# Patient Record
Sex: Female | Born: 1985 | Race: White | Hispanic: No | Smoking: Never smoker
Health system: Southern US, Community
[De-identification: ages and names within clinical notes are randomized; demographics above are authoritative.]

## PROBLEM LIST (undated history)

## (undated) ENCOUNTER — Inpatient Hospital Stay (HOSPITAL_COMMUNITY): Payer: Self-pay

## (undated) ENCOUNTER — Emergency Department (HOSPITAL_COMMUNITY): Admission: EM | Payer: Self-pay | Source: Home / Self Care

## (undated) DIAGNOSIS — E282 Polycystic ovarian syndrome: Secondary | ICD-10-CM

## (undated) DIAGNOSIS — F32A Depression, unspecified: Secondary | ICD-10-CM

## (undated) DIAGNOSIS — Z8619 Personal history of other infectious and parasitic diseases: Secondary | ICD-10-CM

## (undated) DIAGNOSIS — F329 Major depressive disorder, single episode, unspecified: Secondary | ICD-10-CM

## (undated) HISTORY — DX: Major depressive disorder, single episode, unspecified: F32.9

## (undated) HISTORY — DX: Depression, unspecified: F32.A

## (undated) HISTORY — DX: Polycystic ovarian syndrome: E28.2

## (undated) HISTORY — DX: Personal history of other infectious and parasitic diseases: Z86.19

---
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
## 2007-03-25 ENCOUNTER — Emergency Department (HOSPITAL_COMMUNITY): Admission: EM | Admit: 2007-03-25 | Discharge: 2007-03-25 | Payer: Self-pay | Admitting: Family Medicine

## 2007-09-16 ENCOUNTER — Ambulatory Visit (HOSPITAL_COMMUNITY): Admission: RE | Admit: 2007-09-16 | Discharge: 2007-09-16 | Payer: Self-pay | Admitting: Family Medicine

## 2011-11-27 ENCOUNTER — Encounter (HOSPITAL_COMMUNITY): Payer: Self-pay | Admitting: *Deleted

## 2011-11-27 ENCOUNTER — Emergency Department (HOSPITAL_COMMUNITY)
Admission: EM | Admit: 2011-11-27 | Discharge: 2011-11-27 | Disposition: A | Discharge status: Home / Self Care | Payer: BC Managed Care – PPO | Attending: Emergency Medicine | Admitting: Emergency Medicine

## 2011-11-27 DIAGNOSIS — N390 Urinary tract infection, site not specified: Secondary | ICD-10-CM

## 2011-11-27 DIAGNOSIS — R1032 Left lower quadrant pain: Secondary | ICD-10-CM

## 2011-11-27 DIAGNOSIS — R599 Enlarged lymph nodes, unspecified: Secondary | ICD-10-CM | POA: Insufficient documentation

## 2011-11-27 LAB — URINALYSIS, ROUTINE W REFLEX MICROSCOPIC
Bilirubin Urine: NEGATIVE
Hgb urine dipstick: NEGATIVE
Ketones, ur: NEGATIVE mg/dL
Protein, ur: NEGATIVE mg/dL
Urobilinogen, UA: 1 mg/dL (ref 0.0–1.0)

## 2011-11-27 LAB — URINE MICROSCOPIC-ADD ON

## 2011-11-27 LAB — POCT I-STAT, CHEM 8
BUN: 13 mg/dL (ref 6–23)
Creatinine, Ser: 0.9 mg/dL (ref 0.50–1.10)
Glucose, Bld: 109 mg/dL — ABNORMAL HIGH (ref 70–99)
Hemoglobin: 12.6 g/dL (ref 12.0–15.0)
TCO2: 24 mmol/L (ref 0–100)

## 2011-11-27 LAB — WET PREP, GENITAL
Trich, Wet Prep: NONE SEEN
Yeast Wet Prep HPF POC: NONE SEEN

## 2011-11-27 MED ORDER — MORPHINE SULFATE 4 MG/ML IJ SOLN
4.0000 mg | Freq: Once | INTRAMUSCULAR | Status: AC
  Administered 2011-11-27: 4 mg via INTRAVENOUS
  Filled 2011-11-27: qty 1

## 2011-11-27 MED ORDER — NITROFURANTOIN MONOHYD MACRO 100 MG PO CAPS: 100.0000 mg | ORAL_CAPSULE | Freq: Two times a day (BID) | ORAL | Status: AC

## 2011-11-27 MED ORDER — KETOROLAC TROMETHAMINE 30 MG/ML IJ SOLN
30.0000 mg | Freq: Once | INTRAMUSCULAR | Status: AC
  Administered 2011-11-27: 30 mg via INTRAVENOUS
  Filled 2011-11-27: qty 1

## 2011-11-27 MED ORDER — CYCLOBENZAPRINE HCL 10 MG PO TABS: 10.0000 mg | ORAL_TABLET | Freq: Two times a day (BID) | ORAL | Status: AC | PRN

## 2011-11-27 MED ORDER — NAPROXEN 500 MG PO TABS: 500.0000 mg | ORAL_TABLET | Freq: Two times a day (BID) | ORAL | Status: DC

## 2011-11-27 NOTE — Discharge Instructions (Signed)
You were here for evaluation of your left groin pain. You have found to have a urinary tract infection. Please take antibiotic for the full duration as prescribed. For your groin pain, you may take pain medication and muscle relaxant as needed.  Do not drive or operate heavy machinery while taking medication as it can cause drowsiness.  You may use warm/cool compress as needed.  Call the list below to find a primary care doctor.  Return to ER if your symptoms worsen, or if you have other concerns.    Urinary Tract Infection Infections of the urinary tract can start in several places. A bladder infection (cystitis), a kidney infection (pyelonephritis), and a prostate infection (prostatitis) are different types of urinary tract infections (UTIs). They usually get better if treated with medicines (antibiotics) that kill germs. Take all the medicine until it is gone. You or your child may feel better in a few days, but TAKE ALL MEDICINE or the infection may not respond and may become more difficult to treat. HOME CARE INSTRUCTIONS   Drink enough water and fluids to keep the urine clear or pale yellow. Cranberry juice is especially recommended, in addition to large amounts of water.   Avoid caffeine, tea, and carbonated beverages. They tend to irritate the bladder.   Alcohol may irritate the prostate.   Only take over-the-counter or prescription medicines for pain, discomfort, or fever as directed by your caregiver.  To prevent further infections:  Empty the bladder often. Avoid holding urine for long periods of time.   After a bowel movement, women should cleanse from front to back. Use each tissue only once.   Empty the bladder before and after sexual intercourse.  FINDING OUT THE RESULTS OF YOUR TEST Not all test results are available during your visit. If your or your child's test results are not back during the visit, make an appointment with your caregiver to find out the results. Do not assume  everything is normal if you have not heard from your caregiver or the medical facility. It is important for you to follow up on all test results. SEEK MEDICAL CARE IF:   There is back pain.   Your baby is older than 3 months with a rectal temperature of 100.5 F (38.1 C) or higher for more than 1 day.   Your or your child's problems (symptoms) are no better in 3 days. Return sooner if you or your child is getting worse.  SEEK IMMEDIATE MEDICAL CARE IF:   There is severe back pain or lower abdominal pain.   You or your child develops chills.   You have a fever.   Your baby is older than 3 months with a rectal temperature of 102 F (38.9 C) or higher.   Your baby is 3 months old or younger with a rectal temperature of 100.4 F (38 C) or higher.   There is nausea or vomiting.   There is continued burning or discomfort with urination.  MAKE SURE YOU:   Understand these instructions.   Will watch your condition.   Will get help right away if you are not doing well or get worse.  Document Released: 03/06/2005 Document Revised: 05/16/2011 Document Reviewed: 10/09/2006 [facility] Patient Information 2012 [place], [place].  RESOURCE GUIDE  Chronic Pain Problems: Contact [facility]  [phone] Patients need to be referred by their primary care doctor.  Insufficient Money for Medicine: Contact United Way:  call "211" or [facility] 717-271-6504.  No Primary Care Doctor: - Call Health Connect  [phone] - can help you locate a primary care doctor that  accepts your insurance, provides certain services, etc. - Physician Referral Service- [phone]  Agencies that provide inexpensive medical care: - [facility]  [phone] - [facility]  [phone] - [facility]  [phone] - Women's Clinic  [phone] - [facility]  [phone] [facility]  [phone]  Medicaid-accepting [facility]  Providers: - [facility]- [address]  [phone], Mon-Fri 9am-7pm, Sat 9am-1pm - [facility]- [address] [place], Suite [place]  [phone] - [facility]- [address], [address] [place]  [phone] [facility]- [address]  [phone] - Renaye Rakers- [address] [place], [address], [phone]  Only accepts [place] patients after they have their name  applied to their card  Self Pay (no insurance) in [place]: - Sickle Cell Patients: Dr Willey Blade, [facility]  [address] [place], [phone] - [facility]- [address] [place]  [phone]       [facility] [place]- [address]       -     [facility]- see information above (Speak to [employer] if you do not have insurance)       -  [facility]- [address] [place], [phone]       -  [facility]- [address],  [phone]       -  [facility]- [address], [phone]       -  Dr Julio Sicks-  [address], [place], [phone]       -  [facility]- [address] [phone]       -  [facility]- [address], [phone], also [address], [phone]       -    [facility]- [address] [place], [phone], 1st & 3rd Saturday   every month, 10am-1pm  1) Find a Doctor and Pay Out of Pocket Although you won't have to find out who is covered by your insurance plan, it is a good idea to ask around and get recommendations. You will then need to call the office and see if the doctor you have chosen will accept you as a new patient and what types of options they offer for patients who are self-pay. Some doctors offer discounts or will set up payment plans for their patients who do not have insurance, but you will need to ask so you aren't surprised when you get to your  appointment.  2) Contact Your Local Health Department Not all health departments have doctors that can see patients for sick visits, but many do, so it is worth a call to see if yours does. If you don't know where your local health department is, you can check in your phone book. The CDC also has a tool to help you locate your state's health department, and many state websites also have listings of all of their local health departments.  3) Find a Walk-in Clinic If your illness is not likely to be very severe or complicated, you may want to try a walk in clinic. These are popping up all  over the country in pharmacies, drugstores, and shopping centers. They're usually staffed by nurse practitioners or physician assistants that have been trained to treat common illnesses and complaints. They're usually fairly quick and inexpensive. However, if you have serious medical issues or chronic medical problems, these are probably not your best option  STD Testing - [facility] Department of [facility], STD Clinic, [address] [place], phone [phone] or [phone].  Monday - Friday, call for an appointment. [facility] Department of [employer], STD Clinic, [place] [address], [place], phone [phone] or [phone].  Monday - Friday, call for an appointment.  Abuse/Neglect: [facility] Child Abuse Hotline 364-756-9166 [facility] Child Abuse Hotline 737-546-7237 (After Hours)  Emergency Shelter:  [facility] 612-806-3046  Maternity Homes: - Room at the [place] [facility] [phone] - [facility] [phone]  MRSA Hotline #:   484 282 3870  [facility]  [facility] [place]  United Way [facility] Dept. [address]                 [address]         [address] [place] [address]  Blondell Reveal Phone:  [phone]                                  Phone:  [phone]                   Phone:  [phone]  [facility], [phone] - [phone]       -     [facility] in [place], [address]                                  [phone], [facility] Child Abuse Hotline 747-219-1464 or (517)098-3536 (After Hours)   Behavioral Health Services  Substance Abuse Resources: - [facility] Drug Services  (818) [phone] - [facility] [phone] - [facility] [place] [phone] Floydene Flock (702)[phone] - Residential & Outpatient Substance Abuse Program  4108716673  Psychological Services: [facility]  5625391827 Services  [phone] - [facility], [phone] [place]. [address], [place], ACCESS LINE: [phone] or 3647816259, [email or web address]  Dental Assistance  If unable to pay or uninsured, contact:  [facility] or [facility]. to become qualified for the adult dental clinic.  Patients with Medicaid: [facility] [phone] [address], [phone] [address] [phone]  If unable to pay, or uninsured, contact [facility] (409)[phone]) or [facility] Department [phone] in [place], [phone] in [facility]) to become qualified for the adult dental clinic  Other Proofreader Services: - [facility]- [address], [place]  [place], [place], [address], [phone], Ext. [phone], 2nd and 4th Thursday of the month at 6:30am.  10 clients each day by appointment, can sometimes see walk-in patients if someone does not show for an appointment. [facility]- [address] Ether Griffins [place], [place], [address], [phone] - [facility]- [address] [place], [place], [address],  [phone] - [place] [facility]- [phone] [facility] Health Department- [phone] [facility] [phone] -

## 2011-11-27 NOTE — ED Notes (Signed)
Pt states she has L groin pain inside the muscle. She states when she moves it becomes a sharp stabbing pain. States she cannot bear weight to L leg secondary to pain.

## 2011-11-27 NOTE — ED Notes (Signed)
Pt reports pain is 1/10 without movement, but any movement brings pain to 7/10

## 2011-11-27 NOTE — ED Notes (Signed)
Pt reports noting left groin pain approx 2300 yesterday evening, pt awoke at 0200 w/ severe left groin pain that is exacerbated w/ movement.

## 2011-11-27 NOTE — ED Notes (Signed)
PA Tran at bedside. 

## 2011-11-27 NOTE — ED Notes (Signed)
Pt denies any trauma to leg. States she was sitting on couch after work when pain came on.

## 2011-11-27 NOTE — ED Notes (Signed)
Pelvic set up complete.

## 2011-11-27 NOTE — ED Provider Notes (Signed)
History     CSN: [id number]  Arrival date & time 11/27/11  0300   First MD Initiated Contact with Patient 11/27/11 [phone]      Chief Complaint  Patient presents with  . Groin Pain    (Consider location/radiation/quality/duration/timing/severity/associated sxs/prior treatment) HPI  26 year old female who is generally healthy presents complaining of left groin pain. Patient states she was sitting on the couch last night when she experienced a sharp and an achy sensation to her left groin. Pain was acute in onset, persistent, occasionally radiating down to the left knee with movement. Patient state pain felt deep within her groin.  Nothing seems to alleviate the pain, however leg movement seems to make it worse. Patient denies any precipitating factor. She denies any recent strenuous exercise, sexual activity , or trauma. Patient denies fever, chills, nausea, vomiting, diarrhea, chest pain, shortness of breath, abdominal pain, back pain, urinary symptoms. She does not recall her last menstrual period.  History reviewed. No pertinent past medical history.  Past Surgical History  Procedure Date  . Breast enhancement surgery     No family history on file.  History  Substance Use Topics  . Smoking status: Current Everyday Smoker  . Smokeless tobacco: Not on file  . Alcohol Use: Yes     socially    OB History    Grav Para Term Preterm Abortions TAB SAB Ect Mult Living                  Review of Systems  All other systems reviewed and are negative.    Allergies  Review of patient's allergies indicates no known allergies.  Home Medications   Current Outpatient Rx  Name Route Sig Dispense Refill  . LO LOESTRIN FE PO Oral Take 1 tablet by mouth daily.      BP 127/78  Pulse 116  Temp 98.2 F (36.8 C) (Oral)  Resp 20  SpO2 100%  Physical Exam  Nursing note and vitals reviewed. Constitutional: She appears well-developed and well-nourished. No distress.  HENT:  Head:  Normocephalic and atraumatic.  Eyes: Conjunctivae are normal.  Neck: Normal range of motion. Neck supple.  Cardiovascular: Normal rate and regular rhythm.   Pulmonary/Chest: Effort normal and breath sounds normal. She exhibits no tenderness.  Abdominal: Soft. There is no tenderness.  Genitourinary: Vagina normal and uterus normal. There is no rash or lesion on the right labia. There is no rash or lesion on the left labia. Cervix exhibits no motion tenderness and no discharge. Right adnexum displays no mass and no tenderness. Left adnexum displays no mass and no tenderness. No erythema, tenderness or bleeding around the vagina. No vaginal discharge found.  Musculoskeletal: She exhibits no edema.       Tenderness to left inguinal region without overlying skin changes. Increasing pain with left leg range of motion, specifically with hip flexion.  Lymphadenopathy:       Right: No inguinal adenopathy present.       Left: Inguinal adenopathy present.  Neurological:  Reflex Scores:      Patellar reflexes are 2+ on the right side and 2+ on the left side. Skin: No rash noted.    ED Course  Procedures (including critical care time)  Labs Reviewed  URINALYSIS, ROUTINE W REFLEX MICROSCOPIC - Abnormal; Notable for the following:    Color, Urine AMBER (*)  BIOCHEMICALS MAY BE AFFECTED BY COLOR   Nitrite POSITIVE (*)     Leukocytes, UA SMALL (*)  All other components within normal limits  URINE MICROSCOPIC-ADD ON - Abnormal; Notable for the following:    Squamous Epithelial / LPF FEW (*)     Bacteria, UA MANY (*)     All other components within normal limits  POCT PREGNANCY, URINE   No results found.   No diagnosis found.  Results for orders placed during the hospital encounter of 11/27/11  URINALYSIS, ROUTINE W REFLEX MICROSCOPIC      Component Value Range   Color, Urine AMBER (*) YELLOW   APPearance CLEAR  CLEAR   Specific Gravity, Urine 1.028  1.005 - 1.030   pH 7.0  5.0 - 8.0    Glucose, UA NEGATIVE  NEGATIVE mg/dL   Hgb urine dipstick NEGATIVE  NEGATIVE   Bilirubin Urine NEGATIVE  NEGATIVE   Ketones, ur NEGATIVE  NEGATIVE mg/dL   Protein, ur NEGATIVE  NEGATIVE mg/dL   Urobilinogen, UA 1.0  0.0 - 1.0 mg/dL   Nitrite POSITIVE (*) NEGATIVE   Leukocytes, UA SMALL (*) NEGATIVE  POCT PREGNANCY, URINE      Component Value Range   Preg Test, Ur NEGATIVE  NEGATIVE  URINE MICROSCOPIC-ADD ON      Component Value Range   Squamous Epithelial / LPF FEW (*) RARE   WBC, UA 7-10  <3 WBC/hpf   RBC / HPF 0-2  <3 RBC/hpf   Bacteria, UA MANY (*) RARE  WET PREP, GENITAL      Component Value Range   Yeast Wet Prep HPF POC NONE SEEN  NONE SEEN   Trich, Wet Prep NONE SEEN  NONE SEEN   Clue Cells Wet Prep HPF POC FEW (*) NONE SEEN   WBC, Wet Prep HPF POC MODERATE (*) NONE SEEN  POCT I-STAT, CHEM 8      Component Value Range   Sodium 141  135 - 145 mEq/L   Potassium 3.9  3.5 - 5.1 mEq/L   Chloride 106  96 - 112 mEq/L   BUN 13  6 - 23 mg/dL   Creatinine, Ser 1.91  0.50 - 1.10 mg/dL   Glucose, Bld 478 (*) 70 - 99 mg/dL   Calcium, Ion 2.95  6.21 - 1.32 mmol/L   TCO2 24  0 - 100 mmol/L   Hemoglobin 12.6  12.0 - 15.0 g/dL   HCT 30.8  65.7 - 84.6 %   No results found.    MDM  L groin pain.  UA shows evidence of UTI.  Plan to obtain pelvic exam for further eval. No calf pain or leg swelling.  Afebrile.  DDx: musculoskeletal strain, kidney stones, UTI, hernia, ovarian torsion.    7:37 AM Pelvic examination is unremarkable. No CMT. Patient does have left inguinal lymphadenopathy, and increased pain with left hip flexion.  UA with no evidence of bleeding to suggest kidney stone.  Pt has no CVA tenderness. No hernia noted. Abdomen otherwise unremarkable on exam.  Nonsurgical abdomen.  Low suspicion for ovarian torsion, tuboovarian abscess or kidney stone.  I discussed with my attending.  Our plan is to treat for UTI and f/u for reevaluation. Urine culture sent.  GC culture sent. Pt  voice understanding and agrees with plan.    8:22 AM Pain improves with toradol.       Fayrene Helper, PA-C 11/27/11 [phone]

## 2011-11-28 LAB — GC/CHLAMYDIA PROBE AMP, GENITAL
Chlamydia, DNA Probe: NEGATIVE
GC Probe Amp, Genital: NEGATIVE

## 2011-11-29 LAB — URINE CULTURE
Colony Count: >=100000
Culture  Setup Time: 201306191500

## 2011-11-29 NOTE — ED Provider Notes (Signed)
Medical screening examination/treatment/procedure(s) were performed by non-physician practitioner and as supervising physician I was immediately available for consultation/collaboration.   Hanley Seamen, MD 11/29/11 2238

## 2011-11-30 NOTE — ED Notes (Signed)
+  Urine. Patient treated with Macrobid. Sensitive to same. Per protocol MD. °

## 2012-10-31 ENCOUNTER — Inpatient Hospital Stay (HOSPITAL_COMMUNITY)
Admission: AD | Admit: 2012-10-31 | Discharge: 2012-10-31 | Disposition: A | Discharge status: Home / Self Care | Payer: BC Managed Care – PPO | Source: Ambulatory Visit | Attending: Obstetrics & Gynecology | Admitting: Obstetrics & Gynecology

## 2012-10-31 ENCOUNTER — Encounter (HOSPITAL_COMMUNITY): Payer: Self-pay | Admitting: *Deleted

## 2012-10-31 ENCOUNTER — Inpatient Hospital Stay (HOSPITAL_COMMUNITY): Payer: BC Managed Care – PPO

## 2012-10-31 DIAGNOSIS — Z3201 Encounter for pregnancy test, result positive: Secondary | ICD-10-CM | POA: Insufficient documentation

## 2012-10-31 DIAGNOSIS — N831 Corpus luteum cyst of ovary, unspecified side: Secondary | ICD-10-CM | POA: Insufficient documentation

## 2012-10-31 DIAGNOSIS — O26899 Other specified pregnancy related conditions, unspecified trimester: Secondary | ICD-10-CM

## 2012-10-31 DIAGNOSIS — R109 Unspecified abdominal pain: Secondary | ICD-10-CM

## 2012-10-31 LAB — HCG, QUANTITATIVE, PREGNANCY: hCG, Beta Chain, Quant, S: 3616 m[IU]/mL — ABNORMAL HIGH (ref <5)

## 2012-10-31 LAB — URINALYSIS, ROUTINE W REFLEX MICROSCOPIC
Bilirubin Urine: NEGATIVE
Nitrite: NEGATIVE
Protein, ur: NEGATIVE mg/dL
Specific Gravity, Urine: <1.005 — ABNORMAL LOW (ref 1.005–1.030)
Urobilinogen, UA: 0.2 mg/dL (ref 0.0–1.0)

## 2012-10-31 LAB — CBC
Hemoglobin: 12.3 g/dL (ref 12.0–15.0)
MCH: 30 pg (ref 26.0–34.0)
Platelets: 270 10*3/uL (ref 150–400)
RBC: 4.1 MIL/uL (ref 3.87–5.11)
WBC: 7.8 10*3/uL (ref 4.0–10.5)

## 2012-10-31 LAB — WET PREP, GENITAL
Clue Cells Wet Prep HPF POC: NONE SEEN
Trich, Wet Prep: NONE SEEN

## 2012-10-31 NOTE — MAU Note (Signed)
Pt reports she had lower abd pain since Sunday. C/o throbbing pain in LRQ. Positive HPT[facility] refereed to MAU due to RLQ pain and unknown LMP. Pt state she has not had a period in over a year Due to taking low estergen birth control pills. Stopped taking them about 2 months ago but still did not have a period.

## 2012-10-31 NOTE — MAU Provider Note (Signed)
History     CSN: [id number]  Arrival date and time: 10/31/12 1309   First Provider Initiated Contact with Patient 10/31/12 1442      Chief Complaint  Patient presents with  . Abdominal Pain   HPI Jaime Golden 26 y.o. Unknown LMP - none in last year as she was on low dose pills.  Having lower abdominal pain.  Worried as she had a positive pregnancy test.    OB History   Grav Para Term Preterm Abortions TAB SAB Ect Mult Living   1               History reviewed. No pertinent past medical history.  Past Surgical History  Procedure Laterality Date  . Breast enhancement surgery      History reviewed. No pertinent family history.  History  Substance Use Topics  . Smoking status: Current Every Day Smoker  . Smokeless tobacco: Not on file  . Alcohol Use: Yes     Comment: socially    Allergies: No Known Allergies  Prescriptions prior to admission  Medication Sig Dispense Refill  . naproxen sodium (ALEVE) 220 MG tablet Take 220 mg by mouth daily as needed.      . Prenatal Vit-Fe Fumarate-FA (PRENATAL MULTIVITAMIN) TABS Take 1 tablet by mouth daily at 12 noon.        Review of Systems  Constitutional: Negative for fever.  Gastrointestinal: Positive for abdominal pain. Negative for nausea, vomiting, diarrhea and constipation.  Genitourinary:       No vaginal discharge. No vaginal bleeding. No dysuria.   Physical Exam   Blood pressure 126/76, pulse 96, temperature 98 F (36.7 C), temperature source Oral, resp. rate 18, height 5\' 2"  (1.575 m), weight 170 lb 6.4 oz (77.293 kg).  Physical Exam  Nursing note and vitals reviewed. Constitutional: She is oriented to person, place, and time. She appears well-developed and well-nourished.  HENT:  Head: Normocephalic.  Eyes: EOM are normal.  Neck: Neck supple.  GI: Soft. There is tenderness. There is no rebound and no guarding.  Genitourinary:  Speculum exam: Vagina - Small amount of creamy discharge, no odor Cervix  - No contact bleeding Bimanual exam: Cervix closed Uterus non tender, normal size Adnexa non tender, no masses bilaterally GC/Chlam, wet prep done Chaperone present for exam.  Musculoskeletal: Normal range of motion.  Neurological: She is alert and oriented to person, place, and time.  Skin: Skin is warm and dry.  Psychiatric: She has a normal mood and affect.    MAU Course  Procedures Results for orders placed during the hospital encounter of 10/31/12 (from the past 24 hour(s))  URINALYSIS, ROUTINE W REFLEX MICROSCOPIC     Status: Abnormal   Collection Time    10/31/12  1:50 PM      Result Value Range   Color, Urine YELLOW  YELLOW   APPearance CLEAR  CLEAR   Specific Gravity, Urine <1.005 (*) 1.005 - 1.030   pH 6.0  5.0 - 8.0   Glucose, UA NEGATIVE  NEGATIVE mg/dL   Hgb urine dipstick NEGATIVE  NEGATIVE   Bilirubin Urine NEGATIVE  NEGATIVE   Ketones, ur NEGATIVE  NEGATIVE mg/dL   Protein, ur NEGATIVE  NEGATIVE mg/dL   Urobilinogen, UA 0.2  0.0 - 1.0 mg/dL   Nitrite NEGATIVE  NEGATIVE   Leukocytes, UA NEGATIVE  NEGATIVE  POCT PREGNANCY, URINE     Status: Abnormal   Collection Time    10/31/12  1:57 PM  Result Value Range   Preg Test, Ur POSITIVE (*) NEGATIVE  WET PREP, GENITAL     Status: Abnormal   Collection Time    10/31/12  2:55 PM      Result Value Range   Yeast Wet Prep HPF POC NONE SEEN  NONE SEEN   Trich, Wet Prep NONE SEEN  NONE SEEN   Clue Cells Wet Prep HPF POC NONE SEEN  NONE SEEN   WBC, Wet Prep HPF POC TOO NUMEROUS TO COUNT (*) NONE SEEN  HCG, QUANTITATIVE, PREGNANCY     Status: Abnormal   Collection Time    10/31/12  3:18 PM      Result Value Range   hCG, Beta Chain, Quant, S 3616 (*) <5 mIU/mL  CBC     Status: None   Collection Time    10/31/12  3:23 PM      Result Value Range   WBC 7.8  4.0 - 10.5 K/uL   RBC 4.10  3.87 - 5.11 MIL/uL   Hemoglobin 12.3  12.0 - 15.0 g/dL   HCT 65.7  84.6 - 96.2 %   MCV 90.7  78.0 - 100.0 fL   MCH 30.0  26.0  - 34.0 pg   MCHC 33.1  30.0 - 36.0 g/dL   RDW 95.2  84.1 - 32.4 %   Platelets 270  150 - 400 K/uL    MDM Clinical Data: Abdominal pain. Unknown LMP.  OBSTETRIC <14 WK [place] AND TRANSVAGINAL OB US  Technique: Both transabdominal and transvaginal ultrasound  examinations were performed for complete evaluation of the  gestation as well as the maternal uterus, adnexal regions, and  pelvic cul-de-sac. Transvaginal technique was performed to assess  early pregnancy.  Comparison: None.  Intrauterine gestational sac: Present  Yolk sac: Present  Embryo: Not seen  Cardiac Activity: Not seen  CRL: 6.0 mm 5 w 1 d [place] EDC: 07/02/2013  Maternal uterus/adnexae:  Left corpus luteum cyst. Normal appearance of the right ovary. No  subchorionic hemorrhage identified.  IMPRESSION:  Intrauterine gestational sac containing a yolk sac. Embryo is not  yet seen due to small size of the gestational sac. Follow-up  ultrasound in 10 - 14 days is recommended to document presence of  embryo and for dating purposes.    Assessment and Plan  IUGS at 5w 1d  Plan No smoking, no drugs, no alcohol.  Take a prenatal vitamin one by mouth every day.  Eat small frequent snacks to avoid nausea.  Begin prenatal care as soon as possible. Take Tylenol 325 mg 2 tablets by mouth every 4 hours if needed for pain. Drink at least 8 8-oz glasses of water every day.  Jaime Golden 10/31/2012, 4:14 PM

## 2012-11-01 LAB — GC/CHLAMYDIA PROBE AMP: CT Probe RNA: NEGATIVE

## 2012-12-02 LAB — OB RESULTS CONSOLE RPR: RPR: NONREACTIVE

## 2012-12-02 LAB — OB RESULTS CONSOLE GC/CHLAMYDIA
CHLAMYDIA, DNA PROBE: POSITIVE
Gonorrhea: NEGATIVE

## 2012-12-02 LAB — OB RESULTS CONSOLE ANTIBODY SCREEN: ANTIBODY SCREEN: NEGATIVE

## 2012-12-02 LAB — OB RESULTS CONSOLE ABO/RH: RH TYPE: POSITIVE

## 2012-12-02 LAB — OB RESULTS CONSOLE HEPATITIS B SURFACE ANTIGEN: HEP B S AG: NEGATIVE

## 2012-12-02 LAB — OB RESULTS CONSOLE HIV ANTIBODY (ROUTINE TESTING): HIV: NONREACTIVE

## 2012-12-02 LAB — OB RESULTS CONSOLE RUBELLA ANTIBODY, IGM: Rubella: IMMUNE

## 2013-06-10 NOTE — L&D Delivery Note (Signed)
Delivery Note At 7:29 AM a viable and healthy female was delivered via Vaginal, Spontaneous Delivery (Presentation: OA to LOT ).  APGAR: 9, 9; weight P.   Placenta status: Intact, Spontaneous.  Cord: 3V with the following complications: Nuchal.    Anesthesia: Epidural  Episiotomy: None Lacerations: 2nd degree, labial abrasion Suture Repair: 3.0 vicryl rapide Est. Blood Loss (mL): 450cc  Mom to postpartum.  Baby to Couplet care / Skin to Skin.  BOVARD,Abbagale Goguen 07/02/2013, 7:57 AM  Br/A+/ Mirena/ RI  D/W pt circumcision for female infant including r/b/a, wish to proceed

## 2013-06-11 LAB — OB RESULTS CONSOLE GBS: STREP GROUP B AG: NEGATIVE

## 2013-06-29 ENCOUNTER — Telehealth (HOSPITAL_COMMUNITY): Payer: Self-pay | Admitting: *Deleted

## 2013-06-29 ENCOUNTER — Encounter (HOSPITAL_COMMUNITY): Payer: Self-pay | Admitting: *Deleted

## 2013-06-29 NOTE — Telephone Encounter (Signed)
Preadmission screen  

## 2013-06-30 ENCOUNTER — Encounter (HOSPITAL_COMMUNITY): Payer: Self-pay | Admitting: *Deleted

## 2013-06-30 ENCOUNTER — Observation Stay (HOSPITAL_COMMUNITY)
Admission: AD | Admit: 2013-06-30 | Discharge: 2013-06-30 | Disposition: A | Discharge status: Home / Self Care | Payer: BC Managed Care – PPO | Source: Ambulatory Visit | Attending: Obstetrics and Gynecology | Admitting: Obstetrics and Gynecology

## 2013-06-30 DIAGNOSIS — O479 False labor, unspecified: Principal | ICD-10-CM | POA: Insufficient documentation

## 2013-06-30 DIAGNOSIS — IMO0001 Reserved for inherently not codable concepts without codable children: Secondary | ICD-10-CM

## 2013-06-30 LAB — CBC
HCT: 37 % (ref 36.0–46.0)
Hemoglobin: 12.7 g/dL (ref 12.0–15.0)
MCH: 31.6 pg (ref 26.0–34.0)
MCHC: 34.3 g/dL (ref 30.0–36.0)
MCV: 92 fL (ref 78.0–100.0)
PLATELETS: 191 10*3/uL (ref 150–400)
RBC: 4.02 MIL/uL (ref 3.87–5.11)
RDW: 13 % (ref 11.5–15.5)
WBC: 20.6 10*3/uL — AB (ref 4.0–10.5)

## 2013-06-30 LAB — RPR: RPR: NONREACTIVE

## 2013-06-30 MED ORDER — LACTATED RINGERS IV SOLN: 500.0000 mL | INTRAVENOUS | Status: DC | PRN

## 2013-06-30 MED ORDER — LACTATED RINGERS IV SOLN: INTRAVENOUS | Status: DC

## 2013-06-30 MED ORDER — OXYTOCIN 40 UNITS IN LACTATED RINGERS INFUSION - SIMPLE MED: 62.5000 mL/h | INTRAVENOUS | Status: DC

## 2013-06-30 MED ORDER — LIDOCAINE HCL (PF) 1 % IJ SOLN: 30.0000 mL | INTRAMUSCULAR | Status: DC | PRN

## 2013-06-30 MED ORDER — OXYCODONE-ACETAMINOPHEN 5-325 MG PO TABS: 1.0000 | ORAL_TABLET | ORAL | Status: DC | PRN

## 2013-06-30 MED ORDER — ACETAMINOPHEN 325 MG PO TABS: 650.0000 mg | ORAL_TABLET | ORAL | Status: DC | PRN

## 2013-06-30 MED ORDER — IBUPROFEN 600 MG PO TABS: 600.0000 mg | ORAL_TABLET | Freq: Four times a day (QID) | ORAL | Status: DC | PRN

## 2013-06-30 MED ORDER — OXYTOCIN BOLUS FROM INFUSION: 500.0000 mL | INTRAVENOUS | Status: DC

## 2013-06-30 MED ORDER — ONDANSETRON HCL 4 MG/2ML IJ SOLN: 4.0000 mg | Freq: Four times a day (QID) | INTRAMUSCULAR | Status: DC | PRN

## 2013-06-30 MED ORDER — CITRIC ACID-SODIUM CITRATE 334-500 MG/5ML PO SOLN
30.0000 mL | ORAL | Status: DC | PRN
Start: 2013-06-30 — End: 2013-06-30

## 2013-06-30 NOTE — Discharge Instructions (Signed)
Fetal Braxton Hicks Contractions Pregnancy is commonly associated with contractions of the uterus throughout the pregnancy. Towards the end of pregnancy (32 to 34 weeks), these contractions [facility]) can develop more often and may become more forceful. This is not true labor because these contractions do not result in opening (dilatation) and thinning of the cervix. They are sometimes difficult to tell apart from true labor because these contractions can be forceful and people have different pain tolerances. You should not feel embarrassed if you go to the hospital with false labor. Sometimes, the only way to tell if you are in true labor is for your caregiver to follow the changes in the cervix. How to tell the difference between true and false labor:  False labor.  The contractions of false labor are usually shorter, irregular and not as hard as those of true labor.  They are often felt in the front of the lower abdomen and in the groin.  They may leave with walking around or changing positions while lying down.  They get weaker and are shorter lasting as time goes on.  These contractions are usually irregular.  They do not usually become progressively stronger, regular and closer together as with true labor.  True labor.  Contractions in true labor last 30 to 70 seconds, become very regular, usually become more intense, and increase in frequency.  They do not go away with walking.  The discomfort is usually felt in the top of the uterus and spreads to the lower abdomen and low back.  True labor can be determined by your caregiver with an exam. This will show that the cervix is dilating and getting thinner. If there are no prenatal problems or other health problems associated with the pregnancy, it is completely safe to be sent home with false labor and await the onset of true labor. HOME CARE INSTRUCTIONS   Keep up with your usual exercises and instructions.  Take medications  as directed.  Keep your regular prenatal appointment.  Eat and drink lightly if you think you are going into labor.  If BH contractions are making you uncomfortable:  Change your activity position from lying down or resting to walking/walking to resting.  Sit and rest in a tub of warm water.  Drink 2 to 3 glasses of water. Dehydration may cause B-H contractions.  Do slow and deep breathing several times an hour. SEEK IMMEDIATE MEDICAL CARE IF:   Your contractions continue to become stronger, more regular, and closer together.  You have a gushing, burst or leaking of fluid from the vagina.  An oral temperature above 102 F (38.9 C) develops.  You have passage of blood-tinged mucus.  You develop vaginal bleeding.  You develop continuous belly (abdominal) pain.  You have low back pain that you never had before.  You feel the baby's head pushing down causing pelvic pressure.  The baby is not moving as much as it used to. Document Released: 05/27/2005 Document Revised: 08/19/2011 Document Reviewed: 03/08/2013 [facility] Patient Information 2014 [place], [place]. Fetal Movement Counts Patient Name: __________________________________________________ Patient Due Date: ____________________ Performing a fetal movement count is highly recommended in high-risk pregnancies, but it is good for every pregnant woman to do. Your caregiver may ask you to start counting fetal movements at 28 weeks of the pregnancy. Fetal movements often increase:  After eating a full meal.  After physical activity.  After eating or drinking something sweet or cold.  At rest. Pay attention to when you feel  the baby is most active. This will help you notice a pattern of your baby's sleep and wake cycles and what factors contribute to an increase in fetal movement. It is important to perform a fetal movement count at the same time each day when your baby is normally most active.  HOW TO COUNT FETAL  MOVEMENTS 1. Find a quiet and comfortable area to sit or lie down on your left side. Lying on your left side provides the best blood and oxygen circulation to your baby. 2. Write down the day and time on a sheet of paper or in a journal. 3. Start counting kicks, flutters, swishes, rolls, or jabs in a 2 hour period. You should feel at least 10 movements within 2 hours. 4. If you do not feel 10 movements in 2 hours, wait 2 3 hours and count again. Look for a change in the pattern or not enough counts in 2 hours. SEEK MEDICAL CARE IF:  You feel less than 10 counts in 2 hours, tried twice.  There is no movement in over an hour.  The pattern is changing or taking longer each day to reach 10 counts in 2 hours.  You feel the baby is not moving as he or she usually does. Date: ____________ Movements: ____________ Start time: ____________ Finish time: ____________  Date: ____________ Movements: ____________ Start time: ____________ Finish time: ____________ Date: ____________ Movements: ____________ Start time: ____________ Finish time: ____________ Date: ____________ Movements: ____________ Start time: ____________ Finish time: ____________ Date: ____________ Movements: ____________ Start time: ____________ Finish time: ____________ Date: ____________ Movements: ____________ Start time: ____________ Finish time: ____________ Date: ____________ Movements: ____________ Start time: ____________ Finish time: ____________ Date: ____________ Movements: ____________ Start time: ____________ Finish time: ____________  Date: ____________ Movements: ____________ Start time: ____________ Finish time: ____________ Date: ____________ Movements: ____________ Start time: ____________ Finish time: ____________ Date: ____________ Movements: ____________ Start time: ____________ Finish time: ____________ Date: ____________ Movements: ____________ Start time: ____________ Finish time: ____________ Date: ____________  Movements: ____________ Start time: ____________ Finish time: ____________ Date: ____________ Movements: ____________ Start time: ____________ Finish time: ____________ Date: ____________ Movements: ____________ Start time: ____________ Finish time: ____________  Date: ____________ Movements: ____________ Start time: ____________ Finish time: ____________ Date: ____________ Movements: ____________ Start time: ____________ Finish time: ____________ Date: ____________ Movements: ____________ Start time: ____________ Finish time: ____________ Date: ____________ Movements: ____________ Start time: ____________ Finish time: ____________ Date: ____________ Movements: ____________ Start time: ____________ Finish time: ____________ Date: ____________ Movements: ____________ Start time: ____________ Finish time: ____________ Date: ____________ Movements: ____________ Start time: ____________ Finish time: ____________  Date: ____________ Movements: ____________ Start time: ____________ Finish time: ____________ Date: ____________ Movements: ____________ Start time: ____________ Finish time: ____________ Date: ____________ Movements: ____________ Start time: ____________ Finish time: ____________ Date: ____________ Movements: ____________ Start time: ____________ Finish time: ____________ Date: ____________ Movements: ____________ Start time: ____________ Finish time: ____________ Date: ____________ Movements: ____________ Start time: ____________ Finish time: ____________ Date: ____________ Movements: ____________ Start time: ____________ Finish time: ____________  Date: ____________ Movements: ____________ Start time: ____________ Finish time: ____________ Date: ____________ Movements: ____________ Start time: ____________ Finish time: ____________ Date: ____________ Movements: ____________ Start time: ____________ Finish time: ____________ Date: ____________ Movements: ____________ Start time:  ____________ Finish time: ____________ Date: ____________ Movements: ____________ Start time: ____________ Finish time: ____________ Date: ____________ Movements: ____________ Start time: ____________ Finish time: ____________ Date: ____________ Movements: ____________ Start time: ____________ Finish time: ____________  Date: ____________ Movements: ____________ Start time: ____________ Finish time: ____________ Date: ____________ Movements: ____________ Start   time: ____________ Finish time: ____________ Date: ____________ Movements: ____________ Start time: ____________ Finish time: ____________ Date: ____________ Movements: ____________ Start time: ____________ Finish time: ____________ Date: ____________ Movements: ____________ Start time: ____________ Finish time: ____________ Date: ____________ Movements: ____________ Start time: ____________ Finish time: ____________ Date: ____________ Movements: ____________ Start time: ____________ Finish time: ____________  Date: ____________ Movements: ____________ Start time: ____________ Finish time: ____________ Date: ____________ Movements: ____________ Start time: ____________ Finish time: ____________ Date: ____________ Movements: ____________ Start time: ____________ Finish time: ____________ Date: ____________ Movements: ____________ Start time: ____________ Finish time: ____________ Date: ____________ Movements: ____________ Start time: ____________ Finish time: ____________ Date: ____________ Movements: ____________ Start time: ____________ Finish time: ____________ Date: ____________ Movements: ____________ Start time: ____________ Finish time: ____________  Date: ____________ Movements: ____________ Start time: ____________ Finish time: ____________ Date: ____________ Movements: ____________ Start time: ____________ Finish time: ____________ Date: ____________ Movements: ____________ Start time: ____________ Finish time: ____________ Date:  ____________ Movements: ____________ Start time: ____________ Finish time: ____________ Date: ____________ Movements: ____________ Start time: ____________ Finish time: ____________ Date: ____________ Movements: ____________ Start time: ____________ Finish time: ____________ Document Released: 06/26/2006 Document Revised: 05/13/2012 Document Reviewed: 03/23/2012 [employer] Patient Information 2014 [employer].  

## 2013-06-30 NOTE — Discharge Summary (Signed)
Pt given discharge instructions and pt verbalized understanding. Pt was discharged home @ 19:45.

## 2013-07-01 ENCOUNTER — Encounter (HOSPITAL_COMMUNITY): Payer: BC Managed Care – PPO | Admitting: Anesthesiology

## 2013-07-01 ENCOUNTER — Encounter (HOSPITAL_COMMUNITY): Payer: Self-pay | Admitting: *Deleted

## 2013-07-01 ENCOUNTER — Inpatient Hospital Stay (HOSPITAL_COMMUNITY): Payer: BC Managed Care – PPO | Admitting: Anesthesiology

## 2013-07-01 ENCOUNTER — Inpatient Hospital Stay (HOSPITAL_COMMUNITY)
Admission: AD | Admit: 2013-07-01 | Discharge: 2013-07-04 | DRG: 775 | Disposition: A | Discharge status: Home / Self Care | Payer: BC Managed Care – PPO | Source: Ambulatory Visit | Attending: Obstetrics and Gynecology | Admitting: Obstetrics and Gynecology

## 2013-07-01 DIAGNOSIS — IMO0001 Reserved for inherently not codable concepts without codable children: Secondary | ICD-10-CM

## 2013-07-01 LAB — TYPE AND SCREEN
ABO/RH(D): A POS
ANTIBODY SCREEN: NEGATIVE

## 2013-07-01 LAB — CBC
HCT: 39.1 % (ref 36.0–46.0)
HEMOGLOBIN: 13.6 g/dL (ref 12.0–15.0)
MCH: 31.8 pg (ref 26.0–34.0)
MCHC: 34.8 g/dL (ref 30.0–36.0)
MCV: 91.4 fL (ref 78.0–100.0)
Platelets: 203 10*3/uL (ref 150–400)
RBC: 4.28 MIL/uL (ref 3.87–5.11)
RDW: 13.1 % (ref 11.5–15.5)
WBC: 22.9 10*3/uL — ABNORMAL HIGH (ref 4.0–10.5)

## 2013-07-01 MED ORDER — OXYCODONE-ACETAMINOPHEN 5-325 MG PO TABS: 1.0000 | ORAL_TABLET | ORAL | Status: DC | PRN

## 2013-07-01 MED ORDER — ONDANSETRON HCL 4 MG/2ML IJ SOLN: 4.0000 mg | Freq: Four times a day (QID) | INTRAMUSCULAR | Status: DC | PRN

## 2013-07-01 MED ORDER — TERBUTALINE SULFATE 1 MG/ML IJ SOLN: 0.2500 mg | Freq: Once | INTRAMUSCULAR | Status: AC | PRN

## 2013-07-01 MED ORDER — EPHEDRINE 5 MG/ML INJ
10.0000 mg | INTRAVENOUS | Status: DC | PRN
  Filled 2013-07-01: qty 2
  Filled 2013-07-01: qty 4

## 2013-07-01 MED ORDER — IBUPROFEN 600 MG PO TABS: 600.0000 mg | ORAL_TABLET | Freq: Four times a day (QID) | ORAL | Status: DC | PRN

## 2013-07-01 MED ORDER — LIDOCAINE HCL (PF) 1 % IJ SOLN
30.0000 mL | INTRAMUSCULAR | Status: DC | PRN
  Filled 2013-07-01: qty 30

## 2013-07-01 MED ORDER — EPHEDRINE 5 MG/ML INJ
10.0000 mg | INTRAVENOUS | Status: DC | PRN
  Filled 2013-07-01: qty 2

## 2013-07-01 MED ORDER — OXYTOCIN 40 UNITS IN LACTATED RINGERS INFUSION - SIMPLE MED: 62.5000 mL/h | INTRAVENOUS | Status: DC

## 2013-07-01 MED ORDER — OXYTOCIN BOLUS FROM INFUSION: 500.0000 mL | INTRAVENOUS | Status: DC

## 2013-07-01 MED ORDER — CITRIC ACID-SODIUM CITRATE 334-500 MG/5ML PO SOLN: 30.0000 mL | ORAL | Status: DC | PRN

## 2013-07-01 MED ORDER — FLEET ENEMA 7-19 GM/118ML RE ENEM: 1.0000 | ENEMA | RECTAL | Status: DC | PRN

## 2013-07-01 MED ORDER — FENTANYL 2.5 MCG/ML BUPIVACAINE 1/10 % EPIDURAL INFUSION (WH - ANES)
14.0000 mL/h | INTRAMUSCULAR | Status: DC | PRN
  Administered 2013-07-01 – 2013-07-02 (×2): 14 mL/h via EPIDURAL
  Filled 2013-07-01 (×2): qty 125

## 2013-07-01 MED ORDER — LACTATED RINGERS IV SOLN
500.0000 mL | INTRAVENOUS | Status: DC | PRN
  Administered 2013-07-02: 500 mL via INTRAVENOUS

## 2013-07-01 MED ORDER — DIPHENHYDRAMINE HCL 50 MG/ML IJ SOLN: 12.5000 mg | INTRAMUSCULAR | Status: DC | PRN

## 2013-07-01 MED ORDER — BUTORPHANOL TARTRATE 1 MG/ML IJ SOLN: 2.0000 mg | INTRAMUSCULAR | Status: DC | PRN

## 2013-07-01 MED ORDER — LACTATED RINGERS IV SOLN
INTRAVENOUS | Status: DC
  Administered 2013-07-01 – 2013-07-02 (×3): 125 mL/h via INTRAVENOUS

## 2013-07-01 MED ORDER — PHENYLEPHRINE 40 MCG/ML (10ML) SYRINGE FOR IV PUSH (FOR BLOOD PRESSURE SUPPORT)
80.0000 ug | PREFILLED_SYRINGE | INTRAVENOUS | Status: DC | PRN
  Filled 2013-07-01: qty 10
  Filled 2013-07-01: qty 2

## 2013-07-01 MED ORDER — LIDOCAINE HCL (PF) 1 % IJ SOLN
INTRAMUSCULAR | Status: DC | PRN
  Administered 2013-07-01 (×4): 4 mL

## 2013-07-01 MED ORDER — PHENYLEPHRINE 40 MCG/ML (10ML) SYRINGE FOR IV PUSH (FOR BLOOD PRESSURE SUPPORT)
80.0000 ug | PREFILLED_SYRINGE | INTRAVENOUS | Status: DC | PRN
  Filled 2013-07-01: qty 2

## 2013-07-01 MED ORDER — ACETAMINOPHEN 325 MG PO TABS: 650.0000 mg | ORAL_TABLET | ORAL | Status: DC | PRN

## 2013-07-01 MED ORDER — LACTATED RINGERS IV SOLN: 500.0000 mL | Freq: Once | INTRAVENOUS | Status: DC

## 2013-07-01 MED ORDER — OXYTOCIN 40 UNITS IN LACTATED RINGERS INFUSION - SIMPLE MED
1.0000 m[IU]/min | INTRAVENOUS | Status: DC
  Administered 2013-07-02 (×2): 2 m[IU]/min via INTRAVENOUS
  Filled 2013-07-01: qty 1000

## 2013-07-01 NOTE — Anesthesia Preprocedure Evaluation (Signed)
Anesthesia Evaluation  Patient identified by MRN, date of birth, ID band Patient awake    Reviewed: Allergy & Precautions, H&P , NPO status , Patient's Chart, lab work & pertinent test results, reviewed documented beta blocker date and time   History of Anesthesia Complications Negative for: history of anesthetic complications  Airway Mallampati: III TM Distance: >3 FB Neck ROM: full    Dental  (+) Teeth Intact   Pulmonary neg pulmonary ROS,  breath sounds clear to auscultation        Cardiovascular negative cardio ROS  Rhythm:regular Rate:Normal     Neuro/Psych PSYCHIATRIC DISORDERS (depression) negative neurological ROS     GI/Hepatic negative GI ROS, Neg liver ROS,   Endo/Other  BMI 37 PCOS  Renal/GU negative Renal ROS  negative genitourinary   Musculoskeletal   Abdominal   Peds  Hematology negative hematology ROS (+)   Anesthesia Other Findings   Reproductive/Obstetrics (+) Pregnancy                           Anesthesia Physical Anesthesia Plan  ASA: II  Anesthesia Plan: Epidural   Post-op Pain Management:    Induction:   Airway Management Planned:   Additional Equipment:   Intra-op Plan:   Post-operative Plan:   Informed Consent: I have reviewed the patients History and Physical, chart, labs and discussed the procedure including the risks, benefits and alternatives for the proposed anesthesia with the patient or authorized representative who has indicated his/her understanding and acceptance.     Plan Discussed with:   Anesthesia Plan Comments:         Anesthesia Quick Evaluation

## 2013-07-01 NOTE — Anesthesia Procedure Notes (Signed)
Epidural Patient location during procedure: OB Start time: 07/01/2013 9:59 PM  Staffing Performed by: anesthesiologist   Preanesthetic Checklist Completed: patient identified, site marked, surgical consent, pre-op evaluation, timeout performed, IV checked, risks and benefits discussed and monitors and equipment checked  Epidural Patient position: sitting Prep: site prepped and draped and DuraPrep Patient monitoring: continuous pulse ox and blood pressure Approach: midline Injection technique: LOR air  Needle:  Needle type: Tuohy  Needle gauge: 17 G Needle length: 9 cm and 9 Needle insertion depth: 5.5 cm Catheter type: closed end flexible Catheter size: 19 Gauge Catheter at skin depth: 10.5 cm Test dose: negative  Assessment Events: blood not aspirated, injection not painful, no injection resistance, negative IV test and no paresthesia  Additional Notes Discussed risk of headache, infection, bleeding, nerve injury and failed or incomplete block.  Patient voices understanding and wishes to proceed.  Epidural placed easily on first attempt.  No paresthesia.  Patient tolerated procedure well with no apparent complications.  Jasmine DecemberA> Shanon Seawright, MDReason for block:procedure for pain

## 2013-07-01 NOTE — H&P (Signed)
Jaime Golden is a [age] y.o. female  G1P0 at 39+ for IOL in AM, in early labor.  Cervical change in MAU.  Relatively uncomplicated prenatal care.  +FM, no LOF, no VB, ctx increasing in intensity and frequency.   Maternal Medical History:  Reason for admission: Contractions.   Contractions: Onset was yesterday.   Frequency: regular.    Fetal activity: Perceived fetal activity is normal.    Prenatal Complications - Diabetes: none.    OB History   Grav Para Term Preterm Abortions TAB SAB Ect Mult Living   1             G1 present  Past Medical History  Diagnosis Date  . Hx of varicella   . PCOS (polycystic ovarian syndrome)   . Depression    Past Surgical History  Procedure Laterality Date  . Breast enhancement surgery  2007   Family History: family history includes Alcohol abuse in her father; Cancer in her maternal grandmother and paternal grandmother; Heart disease in her maternal grandfather and paternal grandmother; Hypertension in her mother. There is no history of Arthritis, Asthma, Birth defects, COPD, Drug abuse, Diabetes, Depression, Early death, Hearing loss, Hyperlipidemia, Kidney disease, Learning disabilities, Mental illness, Miscarriages / Stillbirths, Mental retardation, Stroke, Vision loss, or Varicose Veins. Social History:  reports that she has never smoked. She has never used smokeless tobacco. She reports that she drinks alcohol. She reports that she does not use illicit drugs.separated Meds PNV  All: NKDA   Prenatal Transfer Tool  Maternal Diabetes: No Genetic Screening: Normal Maternal Ultrasounds/Referrals: Normal Fetal Ultrasounds or other Referrals:  None Maternal Substance Abuse:  No Significant Maternal Medications:  None Significant Maternal Lab Results:  Lab values include: Group B Strep negative Other Comments:  Chl +  Review of Systems  Constitutional: Negative.   HENT: Negative.   Eyes: Negative.   Cardiovascular: Negative.    Gastrointestinal: Negative.   Genitourinary: Negative.   Musculoskeletal: Negative.   Skin: Negative.   Neurological: Negative.   Psychiatric/Behavioral: Negative.     Dilation: 3 Effacement (%): 90 Station: -2 Blood pressure 122/80, pulse 110, temperature 97.4 F (36.3 C), temperature source Oral, resp. rate 16, SpO2 100.00%. Maternal Exam:  Uterine Assessment: Contraction strength is moderate.  Abdomen: Fundal height is appropriate for gestation.   Fetal presentation: vertex  Introitus: Normal vulva. Normal vagina.  Pelvis: adequate for delivery.   Cervix: Cervix evaluated by digital exam.     Physical Exam  Constitutional: She is oriented to person, place, and time. She appears well-developed and well-nourished.  HENT:  Head: Normocephalic and atraumatic.  Cardiovascular: Normal rate and regular rhythm.   Respiratory: Effort normal and breath sounds normal. No respiratory distress. She has no wheezes.  GI: Soft. Bowel sounds are normal. She exhibits no distension. There is no tenderness.  Musculoskeletal: Normal range of motion.  Neurological: She is alert and oriented to person, place, and time.  Skin: Skin is warm and dry.  Psychiatric: She has a normal mood and affect. Her behavior is normal.    Prenatal labs: ABO, Rh: A/Positive/-- (06/25 0000) Antibody: Negative (06/25 0000) Rubella: Immune (06/25 0000) RPR: NON REACTIVE (01/21 1730)  HBsAg: Negative (06/25 0000)  HIV: Non-reactive (06/25 0000)  GBS: Negative (01/02 0000)   Hgb 13.7/Ur Cx neg/Chl + (in PNR marked + on review of Epic records Chl neg), GC neg/CF neg/ First Tri Scr WNL/ AFP WNL/ glucola 83/Plt 284K  US Nl NT Nl anat, limited heart,  post plac, female, previa resolved Completes anat - nl heart  Tdap 03/30/13, Flu 03/04/13  Assessment/Plan: 27yo G1P0 in early labor Epidural/ IV pain meds Pitocin prn    BOVARD,Jaime Golden 07/01/2013, 9:34 PM

## 2013-07-01 NOTE — MAU Note (Signed)
Pt states she contractions have gotten stronger today at 0200. Pt was evaluated yesterday for labor

## 2013-07-01 NOTE — MAU Note (Signed)
Patient states she is having contractions every 3-4 minutes with slight bloody show. Was 2-3 cm yesterday. Denies leaking and reports good fetal movement.

## 2013-07-02 ENCOUNTER — Inpatient Hospital Stay (HOSPITAL_COMMUNITY)
Admission: RE | Admit: 2013-07-02 | Payer: BC Managed Care – PPO | Source: Ambulatory Visit | Admitting: Obstetrics and Gynecology

## 2013-07-02 ENCOUNTER — Encounter (HOSPITAL_COMMUNITY): Payer: Self-pay | Admitting: Obstetrics and Gynecology

## 2013-07-02 LAB — ABO/RH: ABO/RH(D): A POS

## 2013-07-02 LAB — RPR: RPR Ser Ql: NONREACTIVE

## 2013-07-02 MED ORDER — ZOLPIDEM TARTRATE 5 MG PO TABS: 5.0000 mg | ORAL_TABLET | Freq: Every evening | ORAL | Status: DC | PRN

## 2013-07-02 MED ORDER — WITCH HAZEL-GLYCERIN EX PADS
1.0000 "application " | MEDICATED_PAD | CUTANEOUS | Status: DC | PRN
  Administered 2013-07-03: 1 via TOPICAL

## 2013-07-02 MED ORDER — OXYCODONE-ACETAMINOPHEN 5-325 MG PO TABS: 1.0000 | ORAL_TABLET | ORAL | Status: DC | PRN

## 2013-07-02 MED ORDER — DIPHENHYDRAMINE HCL 25 MG PO CAPS: 25.0000 mg | ORAL_CAPSULE | Freq: Four times a day (QID) | ORAL | Status: DC | PRN

## 2013-07-02 MED ORDER — LANOLIN HYDROUS EX OINT: TOPICAL_OINTMENT | CUTANEOUS | Status: DC | PRN

## 2013-07-02 MED ORDER — PRENATAL MULTIVITAMIN CH
1.0000 | ORAL_TABLET | Freq: Every day | ORAL | Status: DC
  Administered 2013-07-02 – 2013-07-04 (×3): 1 via ORAL
  Filled 2013-07-02 (×3): qty 1

## 2013-07-02 MED ORDER — DIBUCAINE 1 % RE OINT
1.0000 "application " | TOPICAL_OINTMENT | RECTAL | Status: DC | PRN
  Administered 2013-07-03: 1 via RECTAL
  Filled 2013-07-02: qty 28

## 2013-07-02 MED ORDER — IBUPROFEN 600 MG PO TABS
600.0000 mg | ORAL_TABLET | Freq: Four times a day (QID) | ORAL | Status: DC
  Administered 2013-07-02 – 2013-07-04 (×9): 600 mg via ORAL
  Filled 2013-07-02 (×9): qty 1

## 2013-07-02 MED ORDER — SENNOSIDES-DOCUSATE SODIUM 8.6-50 MG PO TABS
2.0000 | ORAL_TABLET | ORAL | Status: DC
  Administered 2013-07-03 (×2): 2 via ORAL
  Filled 2013-07-02 (×2): qty 2

## 2013-07-02 MED ORDER — ONDANSETRON HCL 4 MG PO TABS: 4.0000 mg | ORAL_TABLET | ORAL | Status: DC | PRN

## 2013-07-02 MED ORDER — ONDANSETRON HCL 4 MG/2ML IJ SOLN: 4.0000 mg | INTRAMUSCULAR | Status: DC | PRN

## 2013-07-02 MED ORDER — LACTATED RINGERS IV SOLN
INTRAVENOUS | Status: DC
Start: 2013-07-02 — End: 2013-07-04

## 2013-07-02 MED ORDER — SIMETHICONE 80 MG PO CHEW: 80.0000 mg | CHEWABLE_TABLET | ORAL | Status: DC | PRN

## 2013-07-02 MED ORDER — BENZOCAINE-MENTHOL 20-0.5 % EX AERO
1.0000 "application " | INHALATION_SPRAY | CUTANEOUS | Status: DC | PRN
  Administered 2013-07-02: 1 via TOPICAL
  Filled 2013-07-02: qty 56

## 2013-07-02 NOTE — Lactation Note (Signed)
This note was copied from the chart of Jaime Garwin BrothersLindsey Spargur. Lactation Consultation Note  Patient Name: Jaime Golden WGNFA'OToday's Date: 07/02/2013 Reason for consult: Initial assessment Infant 7 hours old. Mom STS with baby. Mom reports that baby not very interested in nursing. Assisted mom to latch baby in the football hold. Baby awake and licking nipple and drops of colostrum. Performed suck training with gloved finger. Baby in quiet-alert state. Baby not will to open mouth to latch. Enc mom to continue with STS, placing drops of colostrum onto lips/into mouth, and attempting to latch. Reviewed basics including waking techniques. Given [facility] breastfeeding brochure, aware of OP/BFSG services.   Maternal Data Formula Feeding for Exclusion: No Has patient been taught Hand Expression?: Yes Does the patient have breastfeeding experience prior to this delivery?: No  Feeding Feeding Type: Breast Fed Length of feed: 0 min  LATCH Score/Interventions Latch: Too sleepy or reluctant, no latch achieved, no sucking elicited. Intervention(s): Skin to skin;Teach feeding cues;Waking techniques Intervention(s): Adjust position;Assist with latch;Breast massage;Breast compression  Audible Swallowing: None Intervention(s): Skin to skin;Hand expression  Type of Nipple: Everted at rest and after stimulation  Comfort (Breast/Nipple): Soft / non-tender     Hold (Positioning): Assistance needed to correctly position infant at breast and maintain latch. Intervention(s): Breastfeeding basics reviewed;Support Pillows;Position options;Skin to skin  LATCH Score: 5  Lactation Tools Discussed/Used     Consult Status Consult Status: Follow-up Date: 07/03/13 Follow-up type: In-patient    Geralynn Golden, Jaime Kwan 07/02/2013, 3:00 PM

## 2013-07-02 NOTE — Anesthesia Postprocedure Evaluation (Signed)
  Anesthesia Post-op Note  Patient: Jaime Golden  Procedure(s) Performed: * No procedures listed *  Patient Location: Mother/Baby  Anesthesia Type:Epidural  Level of Consciousness: awake, alert , oriented and patient cooperative  Airway and Oxygen Therapy: Patient Spontanous Breathing  Post-op Pain: mild  Post-op Assessment: Patient's Cardiovascular Status Stable, Respiratory Function Stable, No headache, No backache, No residual numbness and No residual motor weakness  Post-op Vital Signs: stable  Complications: No apparent anesthesia complications

## 2013-07-03 LAB — CBC
HCT: 25.6 % — ABNORMAL LOW (ref 36.0–46.0)
Hemoglobin: 8.6 g/dL — ABNORMAL LOW (ref 12.0–15.0)
MCH: 31 pg (ref 26.0–34.0)
MCHC: 33.6 g/dL (ref 30.0–36.0)
MCV: 92.4 fL (ref 78.0–100.0)
PLATELETS: 149 10*3/uL — AB (ref 150–400)
RBC: 2.77 MIL/uL — ABNORMAL LOW (ref 3.87–5.11)
RDW: 13.3 % (ref 11.5–15.5)
WBC: 19 10*3/uL — AB (ref 4.0–10.5)

## 2013-07-03 NOTE — Progress Notes (Signed)
PPD #1 No problems Afeb, VSS Fundus firm, NT at U-1 Continue routine postpartum care 

## 2013-07-03 NOTE — Lactation Note (Signed)
This note was copied from the chart of Boy Garwin BrothersLindsey Spratlin. Lactation Consultation Note Follow up consult:  Baby boy 7333 hours old and circumcised and sleepy today.  Has not breastfed since this morning but was spoon fed some colostrum.  Mother was attempting to breastfeed baby upon entering the room in the cradle position and having difficulty.  Baby showing feeding cues.  Encouarged mother to try football hold.  Mother has carpal tunnel and feels uncomfortable with this position.  Mother hand expressed and had a drop of colostrum.  Assisted baby in cross cradle position, encouraging a deep wide latch. Baby breastfed for approx. 12 minutes.  LS 7.  Reviewed waking techniques, cluster feeding, position options, encouraged mother to breastfeed baby 8-12 times a day for more than 10 minutes, switching sides after 30 minutes.  Visitors entered room and mother wanted to try breastfeeding him later.  Encouraged mother to call for further assistance and questions.   Patient Name: Boy Garwin BrothersLindsey Smethurst ZOXWR'UToday's Date: 07/03/2013 Reason for consult: Follow-up assessment   Maternal Data    Feeding Feeding Type: Breast Fed  LATCH Score/Interventions Latch: Repeated attempts needed to sustain latch, nipple held in mouth throughout feeding, stimulation needed to elicit sucking reflex. Intervention(s): Skin to skin;Teach feeding cues;Waking techniques Intervention(s): Adjust position;Assist with latch;Breast massage  Audible Swallowing: A few with stimulation Intervention(s): Skin to skin;Hand expression Intervention(s): Skin to skin  Type of Nipple: Everted at rest and after stimulation  Comfort (Breast/Nipple): Soft / non-tender     Hold (Positioning): Assistance needed to correctly position infant at breast and maintain latch. Intervention(s): Breastfeeding basics reviewed;Support Pillows;Position options;Skin to skin  LATCH Score: 7  Lactation Tools Discussed/Used     Consult Status Date:  07/04/13 Follow-up type: In-patient    Dahlia ByesBerkelhammer, Ruth Jackson SouthBoschen 07/03/2013, 5:00 PM

## 2013-07-04 NOTE — Lactation Note (Signed)
This note was copied from the chart of Jaime Garwin BrothersLindsey Leino. Lactation Consultation Note     I walked in the room, and mom was breast feed in cradle hold, with the baby supine. The baby was latched well, but I had mom switch to cross cradle, positioned the baby in closer to mom, and supported mom and baby with pillows. Mom was excited to see her baby stay latched with strong suckles and swallows for up to 25 minutes. She reports he was latching for a few minutes prior to this. Breast feeding teaching from baby and me book reviewed, hand pump use reviewed, . Mom knows to call lactation for questions/cocners/o/p consults as needed.  Patient Name: Jaime Golden EAVWU'JToday's Date: 07/04/2013 Reason for consult: Follow-up assessment   Maternal Data    Feeding Feeding Type: Bottle Fed - Formula Length of feed: 25 min  LATCH Score/Interventions Latch: Grasps breast easily, tongue down, lips flanged, rhythmical sucking. Intervention(s): Skin to skin;Teach feeding cues;Waking techniques Intervention(s): Adjust position;Assist with latch;Breast massage;Breast compression  Audible Swallowing: Spontaneous and intermittent Intervention(s): Skin to skin;Hand expression  Type of Nipple: Flat  Comfort (Breast/Nipple): Filling, red/small blisters or bruises, mild/mod discomfort (evert today with stimulation and pre pump with ahnd pump)  Problem noted: Mild/Moderate discomfort Interventions (Mild/moderate discomfort): Hand expression (EBM to nipples, no discomfort between feeds, as per mom)  Hold (Positioning): Assistance needed to correctly position infant at breast and maintain latch. (I assisted mom iwth positioning for cross cradle hold) Intervention(s): Breastfeeding basics reviewed;Support Pillows;Position options;Skin to skin  LATCH Score: 7  Lactation Tools Discussed/Used Tools: Shells;Pump Shell Type: Inverted   Consult Status Consult Status: Complete Follow-up type: Call as  needed    Alfred LevinsLee, Lerry Cordrey Anne 07/04/2013, 11:33 AM

## 2013-07-04 NOTE — Progress Notes (Signed)
PPD #2 No problems Afeb, VSS Fundus firm, NT at U-1 Continue routine postpartum care, d/c home 

## 2013-07-04 NOTE — Discharge Summary (Signed)
Obstetric Discharge Summary Reason for Admission: onset of labor Prenatal Procedures: none Intrapartum Procedures: spontaneous vaginal delivery Postpartum Procedures: none Complications-Operative and Postpartum: 2nd degree perineal laceration Hemoglobin  Date Value Range Status  07/03/2013 8.6* 12.0 - 15.0 g/dL Final     DELTA CHECK NOTED     REPEATED TO VERIFY     HCT  Date Value Range Status  07/03/2013 25.6* 36.0 - 46.0 % Final    Physical Exam:  General: alert Lochia: appropriate Uterine Fundus: firm  Discharge Diagnoses: Term Pregnancy-delivered  Discharge Information: Date: 07/04/2013 Activity: pelvic rest Diet: routine Medications: Ibuprofen Condition: stable Instructions: refer to practice specific booklet Discharge to: home Follow-up Information   Follow up with Sharnika Binney D, MD. Schedule an appointment as soon as possible for a visit in 6 weeks.   Specialty:  Obstetrics and Gynecology   Contact information:   [address], [address] [place] [id number] [phone]       Newborn Data: Live born female  Birth Weight: 8 lb 2.5 oz (3700 g) APGAR: 9, 9  Home with mother.  Fong Mccarry D 07/04/2013, 10:07 AM

## 2013-07-04 NOTE — Discharge Instructions (Signed)
As per discharge pamphlet °

## 2014-04-11 ENCOUNTER — Encounter (HOSPITAL_COMMUNITY): Payer: Self-pay | Admitting: Obstetrics and Gynecology

## 2015-07-20 ENCOUNTER — Emergency Department (HOSPITAL_COMMUNITY)
Admission: EM | Admit: 2015-07-20 | Discharge: 2015-07-21 | Disposition: A | Discharge status: Home / Self Care | Payer: Self-pay | Attending: Emergency Medicine | Admitting: Emergency Medicine

## 2015-07-20 ENCOUNTER — Encounter (HOSPITAL_COMMUNITY): Payer: Self-pay | Admitting: Emergency Medicine

## 2015-07-20 DIAGNOSIS — F329 Major depressive disorder, single episode, unspecified: Secondary | ICD-10-CM | POA: Insufficient documentation

## 2015-07-20 DIAGNOSIS — Z79899 Other long term (current) drug therapy: Secondary | ICD-10-CM | POA: Insufficient documentation

## 2015-07-20 DIAGNOSIS — Z3202 Encounter for pregnancy test, result negative: Secondary | ICD-10-CM | POA: Insufficient documentation

## 2015-07-20 DIAGNOSIS — B349 Viral infection, unspecified: Secondary | ICD-10-CM | POA: Insufficient documentation

## 2015-07-20 DIAGNOSIS — Z8639 Personal history of other endocrine, nutritional and metabolic disease: Secondary | ICD-10-CM | POA: Insufficient documentation

## 2015-07-20 NOTE — ED Notes (Signed)
Pt states since Sunday she has had body aches, fever, general fatigue, and decreased appetite  Pt states Tuesday her fever broke but she started to have a cough  Pt states she coughs so hard it makes her chest hurt  Pt states today she stayed home from work because her joints ache and her neck hurts

## 2015-07-21 ENCOUNTER — Emergency Department (HOSPITAL_COMMUNITY): Payer: Self-pay

## 2015-07-21 ENCOUNTER — Encounter (HOSPITAL_COMMUNITY): Payer: Self-pay | Admitting: Emergency Medicine

## 2015-07-21 LAB — URINALYSIS, ROUTINE W REFLEX MICROSCOPIC
Bilirubin Urine: NEGATIVE
Glucose, UA: NEGATIVE mg/dL
Hgb urine dipstick: NEGATIVE
Ketones, ur: NEGATIVE mg/dL
Leukocytes, UA: NEGATIVE
Nitrite: NEGATIVE
Protein, ur: NEGATIVE mg/dL
SPECIFIC GRAVITY, URINE: 1.019 (ref 1.005–1.030)
pH: 8 (ref 5.0–8.0)

## 2015-07-21 LAB — POC URINE PREG, ED: Preg Test, Ur: NEGATIVE

## 2015-07-21 MED ORDER — NAPROXEN 375 MG PO TABS: 375.0000 mg | ORAL_TABLET | Freq: Two times a day (BID) | ORAL | Status: DC

## 2015-07-21 MED ORDER — BENZONATATE 100 MG PO CAPS: 100.0000 mg | ORAL_CAPSULE | Freq: Three times a day (TID) | ORAL | Status: DC

## 2015-07-21 MED ORDER — KETOROLAC TROMETHAMINE 60 MG/2ML IM SOLN
60.0000 mg | Freq: Once | INTRAMUSCULAR | Status: AC
  Administered 2015-07-21: 60 mg via INTRAMUSCULAR
  Filled 2015-07-21: qty 2

## 2015-07-21 MED ORDER — ACETAMINOPHEN 500 MG PO TABS
1000.0000 mg | ORAL_TABLET | Freq: Once | ORAL | Status: AC
  Administered 2015-07-21: 1000 mg via ORAL
  Filled 2015-07-21: qty 2

## 2015-07-21 NOTE — ED Provider Notes (Signed)
CSN: [id number]     Arrival date & time 07/20/15  2313 History  By signing my name below, I, Elon Spanner, attest that this documentation has been prepared under the direction and in the presence of Kirah Stice, MD. Electronically Signed: Elon Spanner, ED Scribe. 07/21/2015. 12:44 AM.     Chief Complaint  Patient presents with  . Influenza   Patient is a 30 y.o. female presenting with cough. The history is provided by the patient. No language interpreter was used.  Cough Cough characteristics:  Non-productive Severity:  Moderate Onset quality:  Gradual Duration:  5 days Timing:  Sporadic Progression:  Unchanged Chronicity:  New Context: not animal exposure   Relieved by:  Nothing Worsened by:  Nothing tried Ineffective treatments:  None tried Associated symptoms: fever and myalgias   Associated symptoms: no wheezing   Fever:    Timing:  Intermittent   Temp source:  Oral   Progression:  Waxing and waning Risk factors: no chemical exposure    HPI Comments: Jaime Golden is a 30 y.o. female who presents to the Emergency Department complaining of a waxing and waning fever TMAX 102.5 (101.1 today) onset 5.5 days ago. Associated symptoms include generalized body aches, fatigue, intermittent diaphoresis, and cough. She has used ibuprofen (yesterday) and Nyquil (two nights ago) with minimal improvement.     Past Medical History  Diagnosis Date  . Hx of varicella   . PCOS (polycystic ovarian syndrome)   . Depression   . SVD (spontaneous vaginal delivery) 07/02/2013   Past Surgical History  Procedure Laterality Date  . Breast enhancement surgery  2007   Family History  Problem Relation Age of Onset  . Hypertension Mother   . Alcohol abuse Father   . Cancer Maternal Grandmother     anal  . Heart disease Maternal Grandfather   . Cancer Paternal Grandmother     colon  . Heart disease Paternal Grandmother   . Arthritis Neg Hx   . Asthma Neg Hx   . Birth defects Neg Hx   .  COPD Neg Hx   . Drug abuse Neg Hx   . Diabetes Neg Hx   . Depression Neg Hx   . Early death Neg Hx   . Hearing loss Neg Hx   . Hyperlipidemia Neg Hx   . Kidney disease Neg Hx   . Learning disabilities Neg Hx   . Mental illness Neg Hx   . Miscarriages / Stillbirths Neg Hx   . Mental retardation Neg Hx   . Stroke Neg Hx   . Vision loss Neg Hx   . Varicose Veins Neg Hx    Social History  Substance Use Topics  . Smoking status: Never Smoker   . Smokeless tobacco: Never Used  . Alcohol Use: No     Comment: socially   OB History    Gravida Para Term Preterm AB TAB SAB Ectopic Multiple Living   Review of Systems  Constitutional: Positive for fever.  Respiratory: Positive for cough. Negative for wheezing.   Musculoskeletal: Positive for myalgias.  All other systems reviewed and are negative.  A complete 10 system review of systems was obtained and all systems are negative except as noted in the HPI and PMH.   Allergies  Review of patient's allergies indicates no known allergies.  Home Medications   Prior to Admission medications   Medication Sig Start Date End  Date Taking? Authorizing Provider  levonorgestrel (MIRENA) 20 MCG/24HR IUD 1 each by Intrauterine route once.   Yes Historical Provider, MD  venlafaxine XR (EFFEXOR-XR) 75 MG 24 hr capsule Take 75 mg by mouth daily with breakfast.   Yes Historical Provider, MD   BP 116/69 mmHg  Pulse 114  Temp(Src) 101.6 F (38.7 C) (Oral)  Resp 20  SpO2 98% Physical Exam  Constitutional: She is oriented to person, place, and time. She appears well-developed and well-nourished. No distress.  HENT:  Head: Normocephalic and atraumatic.  Mouth/Throat: Oropharynx is clear and moist.  Normal phonation.    Eyes: Conjunctivae and EOM are normal. Pupils are equal, round, and reactive to light.  Neck: Normal range of motion. Neck supple. No tracheal deviation present.  No bruits.  No stridor.  No lymphadenopathy.     Cardiovascular: Normal rate, regular rhythm and intact distal pulses.   Pulmonary/Chest: Effort normal and breath sounds normal. No respiratory distress. She has no wheezes. She has no rales.  Abdominal: Soft. Bowel sounds are normal. There is no tenderness. There is no rebound and no guarding.  Musculoskeletal: Normal range of motion.  Lymphadenopathy:    She has no cervical adenopathy.  Neurological: She is alert and oriented to person, place, and time. She has normal reflexes.  Skin: Skin is warm and dry.  Psychiatric: She has a normal mood and affect. Her behavior is normal.  Nursing note and vitals reviewed.   ED Course  Procedures (including critical care time)  DIAGNOSTIC STUDIES: Oxygen Saturation is 98% on RA, normal by my interpretation.    COORDINATION OF CARE:  1:00 AM Discussed plan to obtain labs.  Patient acknowledges and agrees with plan.    Labs Review Labs Reviewed - No data to display  Imaging Review No results found. I have personally reviewed and evaluated these images and lab results as part of my medical decision-making.   EKG Interpretation None      MDM   Final diagnoses:  None   Viral syndrome:  Feeling better post medication.  Will treat symptomatically.  Follow up with your PMD    I personally performed the services described in this documentation, which was scribed in my presence. The recorded information has been reviewed and is accurate.      Cy Blamer, MD 07/21/15 [phone]

## 2015-07-21 NOTE — ED Notes (Signed)
Patient transported to X-ray 

## 2016-06-11 IMAGING — CR DG CHEST 2V
2 series · 2 of 2 positions shown · non-contrast
Comparison: None.

CLINICAL DATA: 29-year-old female with congestion.

EXAM:
CHEST  2 VIEW

[w chest pa]
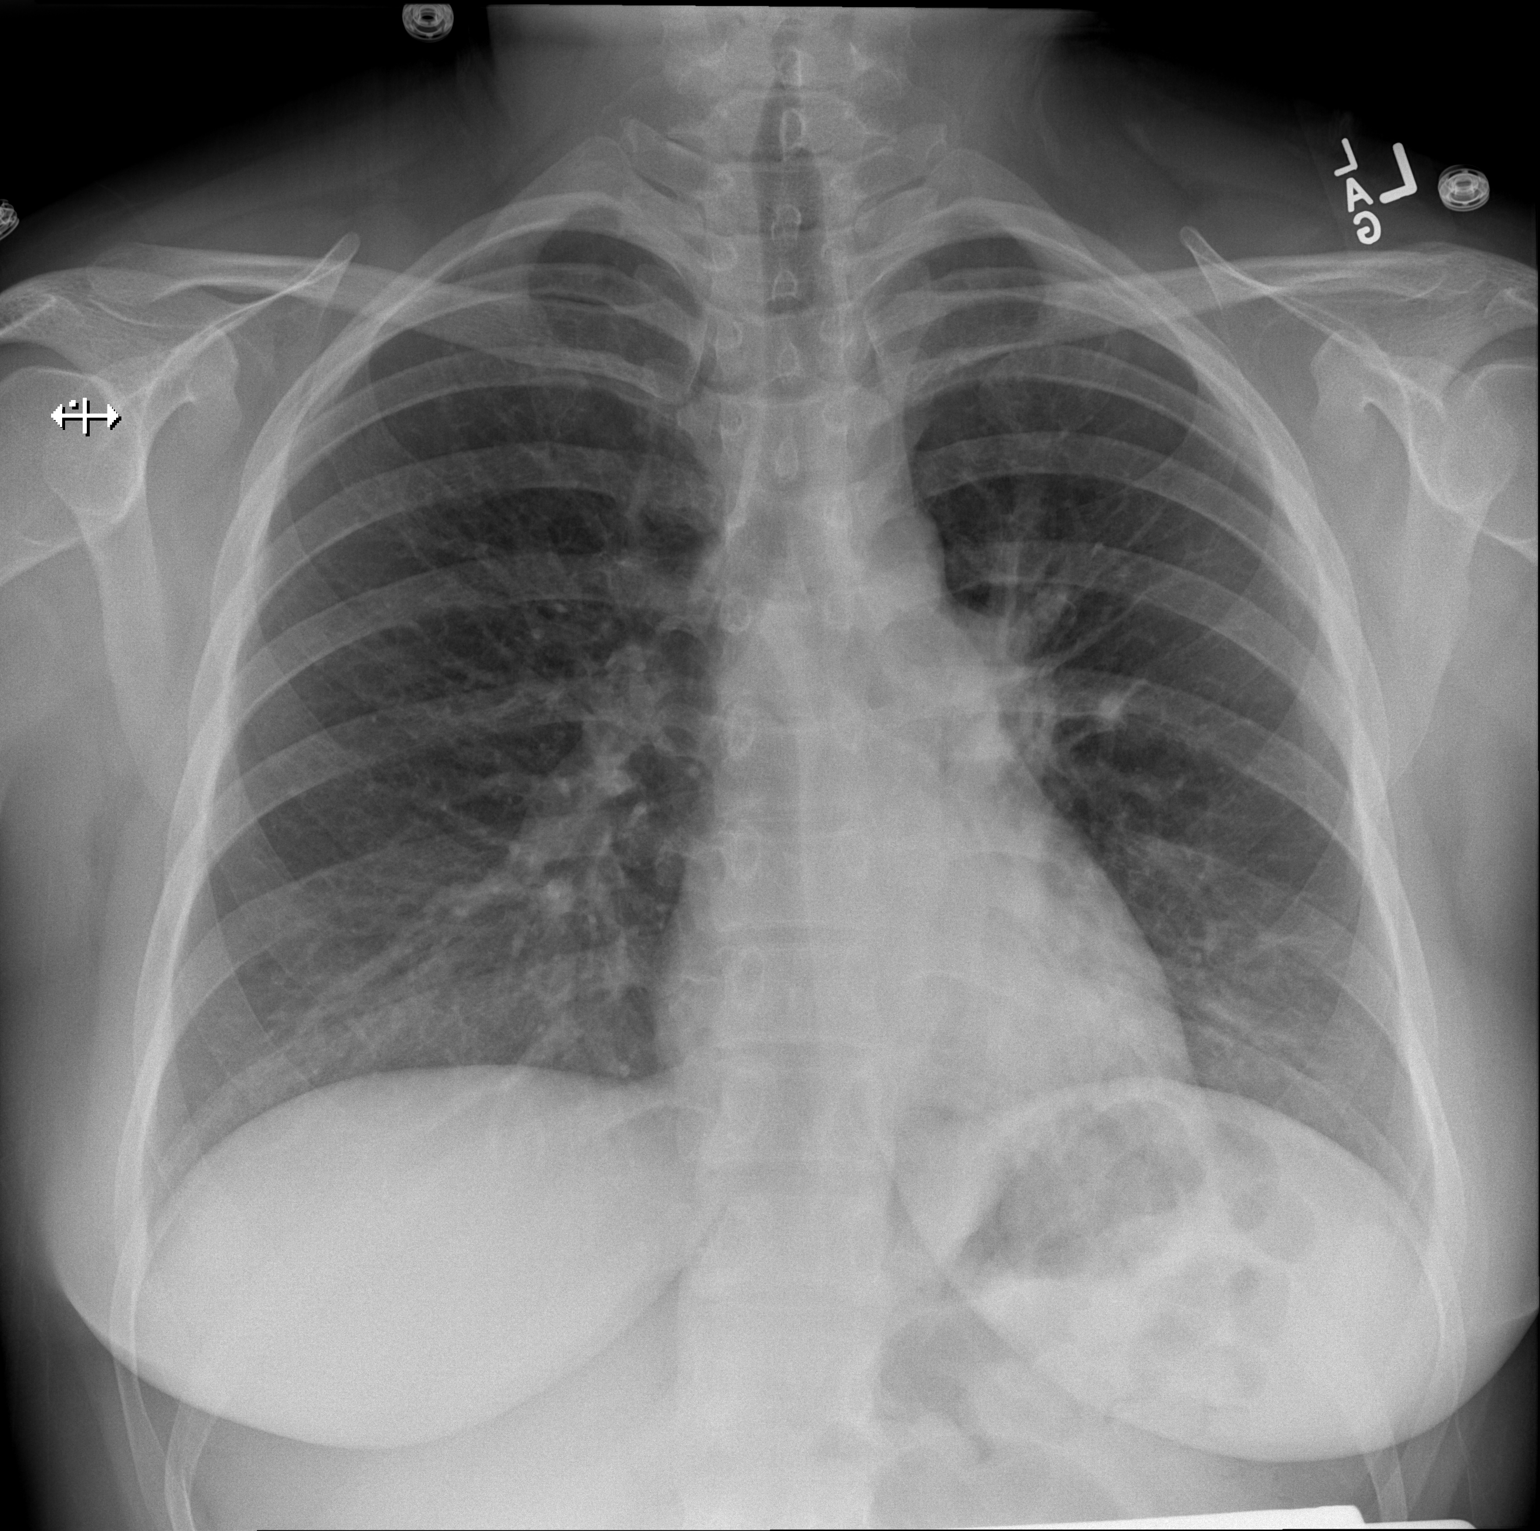

[w chest lat]
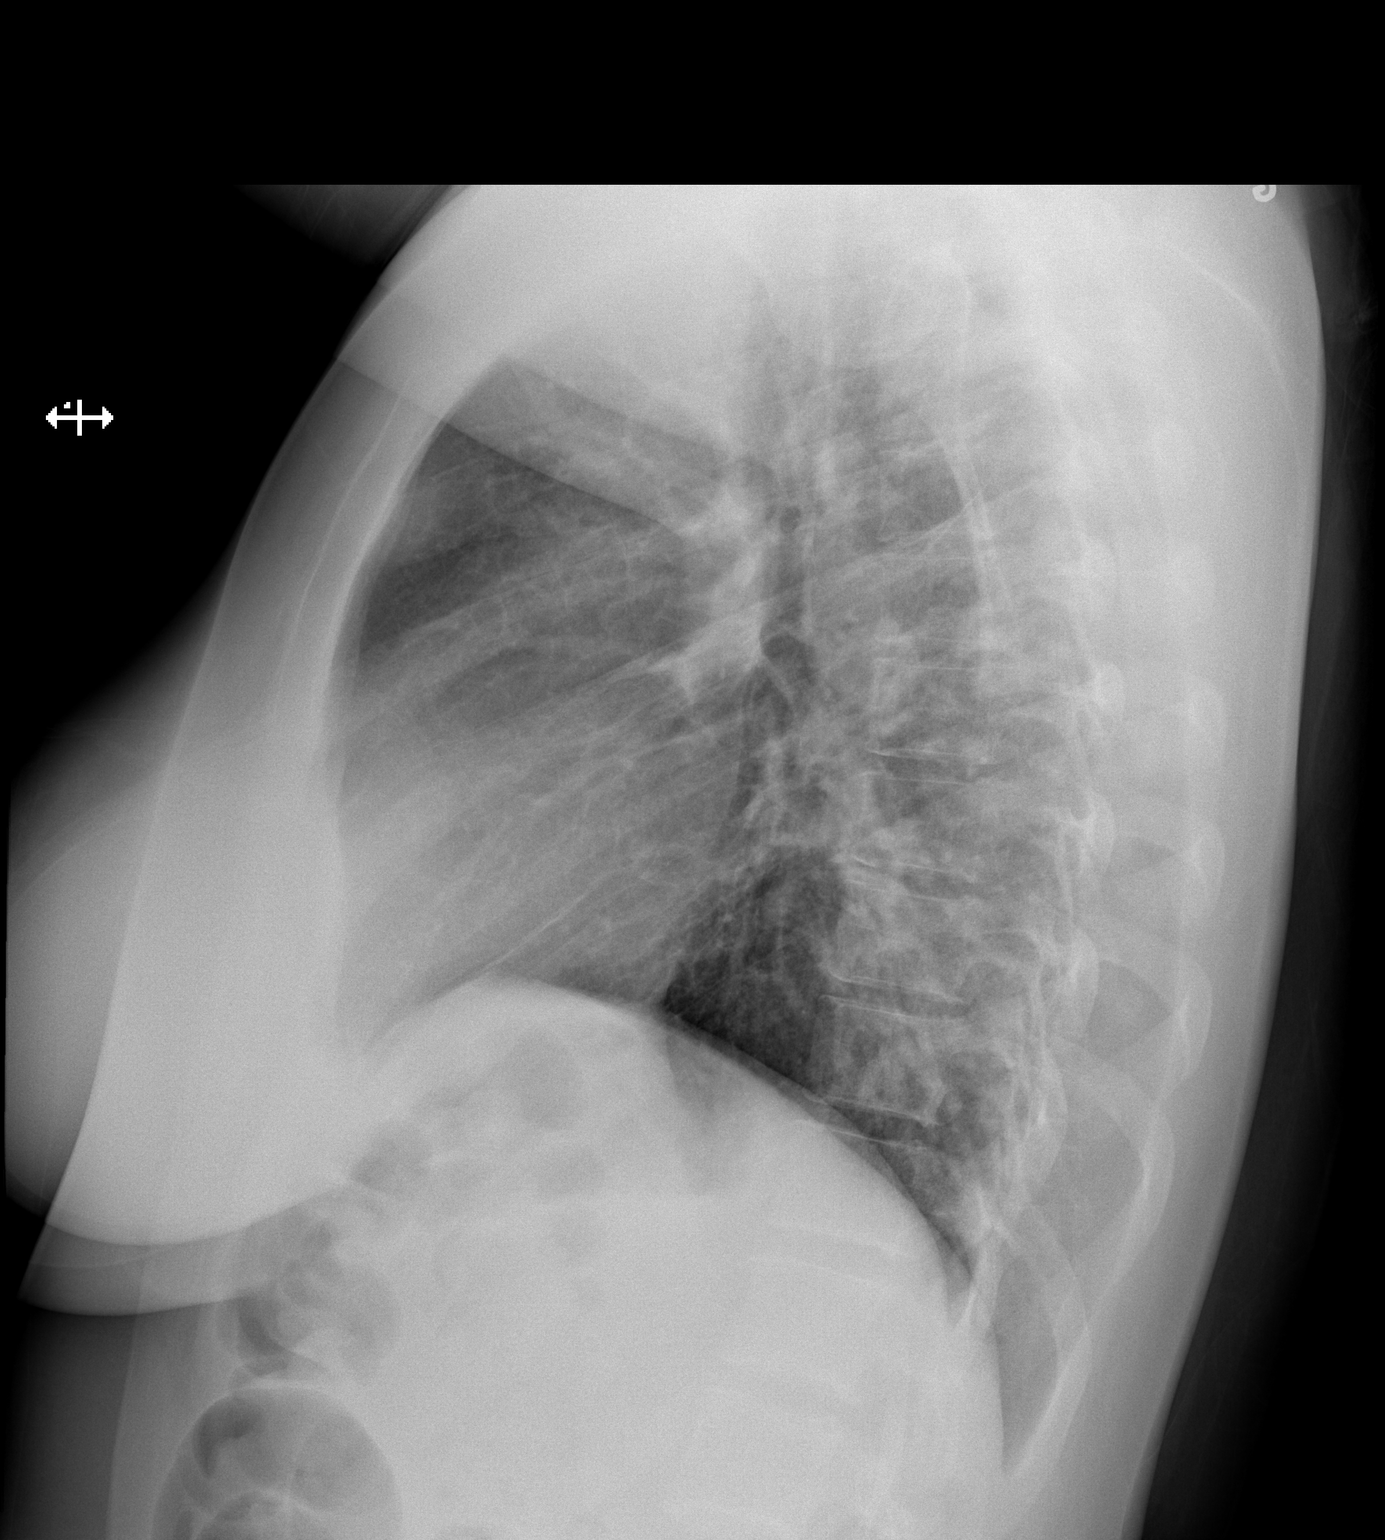

[2 of 2 positions shown; findings below may reference images not displayed]

FINDINGS: The heart size and mediastinal contours are within normal limits.
Both lungs are clear. The visualized skeletal structures are
unremarkable.
IMPRESSION: No active cardiopulmonary disease.

## 2017-09-18 ENCOUNTER — Emergency Department (HOSPITAL_COMMUNITY): Admission: EM | Admit: 2017-09-18 | Discharge: 2017-09-18 | Discharge status: Against Medical Advice | Payer: Self-pay

## 2017-09-18 NOTE — ED Notes (Signed)
Called pt X3 no answer 

## 2019-06-10 ENCOUNTER — Other Ambulatory Visit: Payer: Self-pay

## 2019-06-10 ENCOUNTER — Ambulatory Visit (INDEPENDENT_AMBULATORY_CARE_PROVIDER_SITE_OTHER): Payer: 59 | Admitting: Clinical

## 2019-06-10 DIAGNOSIS — F331 Major depressive disorder, recurrent, moderate: Secondary | ICD-10-CM | POA: Diagnosis not present

## 2019-06-10 DIAGNOSIS — F411 Generalized anxiety disorder: Secondary | ICD-10-CM | POA: Diagnosis not present

## 2019-06-10 NOTE — Progress Notes (Signed)
Virtual Visit via Video Note  I connected with Jaime Golden on 06/10/19 at  3:00 PM EST by a video enabled telemedicine application and verified that I am speaking with the correct person using two identifiers.  Location: Patient: Home Provider: Office   I discussed the limitations of evaluation and management by telemedicine and the availability of in person appointments. The patient expressed understanding and agreed to proceed.    Comprehensive Clinical Assessment (CCA) Note  06/10/2019 Jaime Golden [id number]  Visit Diagnosis:    Major Depressive Disorder, Recurrent, Moderate, w/o psychotic features          Generalized Anxiety Disorder    CCA Part One  Part One has been completed on paper by the patient.  (See scanned document in Chart Review)  CCA Part Two A  Intake/Chief Complaint:  CCA Intake With Chief Complaint CCA Part Two Date: 06/10/19 Chief Complaint/Presenting Problem: The patient notes, " Anxiety is my main issue as well as loss of intrest and fatigue". Patients Currently Reported Symptoms/Problems: Anxiety Collateral Involvement: None Individual's Strengths: The patient identies , " I always do what i say i am going to do, i am trustworthy, and i like to help others". Individual's Preferences: The patient notes, " I am most likely to be watching Tv shows or doing crossword puzzles". Individual's Abilities: The patient notes, " I am a single mom so parenting takes up most of my time, i do not do alot for myself". Type of Services Patient Feels Are Needed: Therapy and Medication Management Initial Clinical Notes/Concerns: The Patient notes i have had difficulty with Anxiety for 6-25yrs now. There has not been a significant event that is identified as a trigger.  Mental Health Symptoms Depression:  Depression: Change in energy/activity, Difficulty Concentrating, Fatigue, Irritability, Worthlessness, Sleep (too much or little)  Mania:  Mania: N/A  Anxiety:    Anxiety: Difficulty concentrating, Restlessness, Sleep, Tension  Psychosis:  Psychosis: N/A  Trauma:  Trauma: N/A  Obsessions:  Obsessions: N/A  Compulsions:  Compulsions: N/A  Inattention:  Inattention: N/A  Hyperactivity/Impulsivity:  Hyperactivity/Impulsivity: N/A  Oppositional/Defiant Behaviors:  Oppositional/Defiant Behaviors: N/A  Borderline Personality:  Emotional Irregularity: N/A  Other Mood/Personality Symptoms:  Other Mood/Personality Symtpoms: None identified   Mental Status Exam Appearance and self-care  Stature:  Stature: Average  Weight:  Weight: Average weight  Clothing:  Clothing: Casual  Grooming:  Grooming: Normal  Cosmetic use:  Cosmetic Use: Age appropriate  Posture/gait:  Posture/Gait: Normal  Motor activity:  Motor Activity: Not Remarkable  Sensorium  Attention:  Attention: Normal  Concentration:  Concentration: Anxiety interferes  Orientation:  Orientation: X5  Recall/memory:  Recall/Memory: Normal  Affect and Mood  Affect:  Affect: Appropriate  Mood:  Mood: Anxious  Relating  Eye contact:  Eye Contact: None  Facial expression:  Facial Expression: Anxious  Attitude toward examiner:  Attitude Toward Examiner: Cooperative  Thought and Language  Speech flow: Speech Flow: Normal  Thought content:  Thought Content: Appropriate to mood and circumstances  Preoccupation:  Preoccupations: (None idenitfied)  Hallucinations:  Hallucinations: (None)  Organization:   Logical  Transport planner of Knowledge:  Fund of Knowledge: Average  Intelligence:  Intelligence: Average  Abstraction:  Abstraction: Normal  Judgement:  Judgement: Normal  Reality Testing:  Reality Testing: Realistic  Insight:  Insight: Good  Decision Making:  Decision Making: Normal  Social Functioning  Social Maturity:  Social Maturity: Isolates  Social Judgement:  Social Judgement: Normal  Stress  Stressors:  Stressors: Work  Coping Ability:  Coping Ability: Normal  Skill  Deficits:   None noted  Supports:   Family   Family and Psychosocial History: Family history Marital status: Divorced Divorced, when?: The patient notes being Divorced for the past 57yrs What types of issues is patient dealing with in the relationship?: NA Additional relationship information: No additional Information Are you sexually active?: Yes What is your sexual orientation?: Heterosexual Has your sexual activity been affected by drugs, alcohol, medication, or emotional stress?: None Does patient have children?: Yes How many children?: 1 How is patient's relationship with their children?: positive  Childhood History:  Childhood History By whom was/is the patient raised?: Mother Additional childhood history information: None Description of patient's relationship with caregiver when they were a child: Positive Patient's description of current relationship with people who raised him/her: Postive How were you disciplined when you got in trouble as a child/adolescent?: The patient notes not having difficulty with getting into trouble. She notes ve never been grounded or spanked Does patient have siblings?: No(The client notes knowing aof 1 half sister who she is not connected with) Did patient suffer any verbal/emotional/physical/sexual abuse as a child?: No Did patient suffer from severe childhood neglect?: No Has patient ever been sexually abused/assaulted/raped as an adolescent or adult?: No Was the patient ever a victim of a crime or a disaster?: No Witnessed domestic violence?: No Has patient been effected by domestic violence as an adult?: No  CCA Part Two B  Employment/Work Situation: Employment / Work Situation Employment situation: Employed 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?: No Are There Guns or Other Weapons in Your Home?: No Are These Comptroller?: No Who Could Verify You Are Able To Have These Secured:: NA  Education: Education School Currently Attending: NA Last Grade Completed: 12 Name of High School: [employer] Did [place] Graduate From [employer]?: Yes Did Theme park manager?: Yes What Type of College Degree Do you Have?: Clinical research associate in [employer] Did You Attend Graduate School?: Yes What is Your Geophysicist/field seismologist Degree?: None What Was Your Major?: Kinesiology- Study of Movement of the body Did You Have Any Special Interests In School?: Sports Medicine Did You Have An Individualized Education Program (IIEP): No Did You Have Any Difficulty At School?: No  Religion: Religion/Spirituality Are You A Religious Person?: Yes What is Your Religious Affiliation?: Christian How Might This Affect Treatment?: No Impact  Leisure/Recreation: Leisure / Recreation Leisure and Hobbies: None identified  Exercise/Diet: Exercise/Diet Do You Exercise?: No Have You Gained or Lost A Significant Amount of Weight in the Past Six Months?: No Do You Follow a Special Diet?: No Do You Have Any Trouble Sleeping?: Yes Explanation of Sleeping Difficulties: Difficulty Falling Asleep  CCA Part Two C  Alcohol/Drug Use: Alcohol / Drug Use Pain Medications: None Prescriptions: Prozac 20mg  2x per day Over the Counter: Multivitamin History of alcohol / drug use?: No history of alcohol / drug abuse                      CCA Part Three  ASAM's:  Six Dimensions of Multidimensional Assessment  Dimension 1:  Acute Intoxication and/or Withdrawal  Potential:     Dimension 2:  Biomedical Conditions and Complications:     Dimension 3:  Emotional, Behavioral, or Cognitive Conditions and Complications:     Dimension 4:   Readiness to Change:     Dimension 5:  Relapse, Continued use, or Continued Problem Potential:     Dimension 6:  Recovery/Living Environment:      Substance use Disorder (SUD)    Social Function:  Social Functioning Social Maturity: Isolates Social Judgement: Normal  Stress:  Stress Stressors: Work Coping Ability: Normal Patient Takes Medications The Way The Doctor Instructed?: Yes Priority Risk: Low Acuity  Risk Assessment- Self-Harm Potential: Risk Assessment For Self-Harm Potential Thoughts of Self-Harm: No current thoughts Method: No plan Availability of Means: No access/NA Additional Comments for Self-Harm Potential: The client notes no S/I  Risk Assessment -Dangerous to Others Potential: Risk Assessment For Dangerous to Others Potential Method: No Plan Availability of Means: No access or NA Intent: Vague intent or NA Notification Required: No need or identified person Additional Comments for Danger to Others Potential: No H/I  DSM5 Diagnoses: Patient Active Problem List   Diagnosis Date Noted  . Major depressive disorder, recurrent episode, moderate (HCC) 06/10/2019  . Generalized anxiety disorder 06/10/2019  . SVD (spontaneous vaginal delivery) 07/02/2013  . Active labor 07/01/2013  . Active labor at term 06/30/2013    Patient Centered Plan: Patient is on the following Treatment Plan(s):  Anxiety and Depression  Recommendations for Services/Supports/Treatments: Recommendations for Services/Supports/Treatments Recommendations For Services/Supports/Treatments: Medication Management, Individual Therapy  Treatment Plan Summary: OP Treatment Plan Summary: The patient will work with the OTP therapist to manage her symptoms of Anxiety/Mood as measured by having fewer than 3 anxiety/mood episodes per week, as evidenced by the patient report.  Referrals to Alternative Service(s): Referred to Alternative Service(s):   Place:   Date:   Time:    Referred to  Alternative Service(s):   Place:   Date:   Time:    Referred to Alternative Service(s):   Place:   Date:   Time:    Referred to Alternative Service(s):   Place:   Date:   Time:     I discussed the assessment and treatment plan with the patient. The patient was provided an opportunity to ask questions and all were answered. The patient agreed with the plan and demonstrated an understanding of the instructions.   The patient was advised to call back or seek an in-person evaluation if the symptoms worsen or if the condition fails to improve as anticipated.  I provided 60 minutes of non-face-to-face time during this encounter.   Winfred Burnerry T Charrie Mcconnon, LCSW

## 2019-07-08 ENCOUNTER — Ambulatory Visit (INDEPENDENT_AMBULATORY_CARE_PROVIDER_SITE_OTHER): Payer: 59 | Admitting: Clinical

## 2019-07-08 ENCOUNTER — Other Ambulatory Visit: Payer: Self-pay

## 2019-07-08 DIAGNOSIS — F331 Major depressive disorder, recurrent, moderate: Secondary | ICD-10-CM

## 2019-07-08 DIAGNOSIS — F411 Generalized anxiety disorder: Secondary | ICD-10-CM

## 2019-07-08 NOTE — Progress Notes (Addendum)
Virtual Visit via Video Note  I connected with Jaime Golden on 07/08/19 at  9:00 AM EST by a video enabled telemedicine application and verified that I am speaking with the correct person using two identifiers.  Location: Patient: Home  Provider: Office   I discussed the limitations of evaluation and management by telemedicine and the availability of in person appointments. The patient expressed understanding and agreed to proceed.         THERAPIST PROGRESS NOTE  Session Time:9:00AM-9:40AM  Participation Level: Active  Behavioral Response: CasualAlertDepressed  Type of Therapy: Individual Therapy  Treatment Goals addressed: Coping  Interventions: CBT  Summary: Jaime Golden is a 34 y.o. female who presents with Major Depressive Disorder and Generalized Anxiety. The OPT therapist worked with the patient for her initial session. The OPT therapist utilized Motivational Interviewing to assist in creating therapeutic repore. The patient in the session was engaged and work in collaboration giving feedback about her triggers and symptoms over the past few weeks. The OPT therapist utilized Cognitive Behavioral Therapy through cognitive restructuring as well as worked with the patient on coping strategies to assist in management of Depression and Anxiety. The OPT therapist inquired for holistic care about the patients adherence to medication therapy.  Suicidal/Homicidal: Nowithout intent/plan  Therapist Response:The OPT therapist worked with the patient for the patients initial scheduled session. The patient was engaged in her session and gave feedback in relation to triggers, symptoms, and behavior responses over the past 2 weeks. The OPT therapist worked with the patient utilizing an in session Cognitive Behavioral Therapy exercise. The patient was responsive in the session and verbalized, " I am willing to work on my sleep cycle and my after work routine each day to get more  accomplished and have less to work about at night". The patient indicated compliance but no identifiable effectiveness in relation to management of her Anxiety with her current medication therapy, however, had a change in medication just 1 week ago and was encouraged by the OPT therapist to give the medicine more time before determining its not helping. The OPT therapist will continue treatment work with the patient in her next scheduled session.  Plan: Return again in 2 weeks.  Diagnosis: Axis I: Major depressive disorder, recurrent episode, moderate and Generalized Anxiety Disorder    Axis II: No diagnosis  I discussed the assessment and treatment plan with the patient. The patient was provided an opportunity to ask questions and all were answered. The patient agreed with the plan and demonstrated an understanding of the instructions.   The patient was advised to call back or seek an in-person evaluation if the symptoms worsen or if the condition fails to improve as anticipated.  I provided 40 minutes of non-face-to-face time during this encounter.   [name], LCSW 07/08/2019

## 2019-07-28 ENCOUNTER — Ambulatory Visit (INDEPENDENT_AMBULATORY_CARE_PROVIDER_SITE_OTHER): Payer: 59 | Admitting: Clinical

## 2019-07-28 ENCOUNTER — Other Ambulatory Visit: Payer: Self-pay

## 2019-07-28 DIAGNOSIS — F411 Generalized anxiety disorder: Secondary | ICD-10-CM

## 2019-07-28 DIAGNOSIS — F331 Major depressive disorder, recurrent, moderate: Secondary | ICD-10-CM

## 2019-07-28 NOTE — Progress Notes (Signed)
Virtual Visit via Video Note  I connected with Jaime Golden on 07/28/19 at 10:00 AM EST by a video enabled telemedicine application and verified that I am speaking with the correct person using two identifiers.  Location: Patient: Home Provider: Office   I discussed the limitations of evaluation and management by telemedicine and the availability of in person appointments. The patient expressed understanding and agreed to proceed.           THERAPIST PROGRESS NOTE  Session Time: 10:00AM-10:45AM  Participation Level: Active  Behavioral Response: CasualAlertDepressed  Type of Therapy: Individual Therapy  Treatment Goals addressed: Coping  Interventions: CBT  Summary: Jaime Golden is a 34 y.o. female who presents with Depression. The OPT therapist worked with the client for her ongoing OPT treatment. The OPT therapist utilized Motivational Interviewing for therapeutic repore. The patient in the session was engaged and work in collaboration giving feedback about her triggers and symptoms over the past few weeks including work, caregiving, and family interaction. The OPT therapist utilized Cognitive Behavioral Therapy through cognitive restructuring as well as worked with the patient on coping strategies to assist in management of Depression and Anxiety including not over-extending herself and creating a better balance between work, caregiving, and self care. The OPT therapist inquired for holistic care about the patients adherence to medication therapy.  Suicidal/Homicidal: Nowithout intent/plan  Therapist Response: The OPT therapist worked with the patient for the patients scheduled session as the patient was oriented and prepared participating and offering feedback. The patient gave feedback in relation to triggers, symptoms, and behavior responses over the past 2 weeks. The OPT therapist worked with the patient utilizing an in session Cognitive Behavioral Therapy exercise. The  patient was responsive in the session and verbalized, " I realize I need to work on not getting talked into taking on more and overextending myself". The patient indicated compliance but difficulty identifying ongoing effectiveness in relation to management of her Anxiety with her current medication therapy. The OPT therapist will continue treatment work with the patient in her next scheduled session.  Plan: Return again in 3 weeks.  Diagnosis: Axis I: Major depressive disorder, recurrent episode, moderate and Generalized Anxiety Disorder    Axis II: No diagnosis  I discussed the assessment and treatment plan with the patient. The patient was provided an opportunity to ask questions and all were answered. The patient agreed with the plan and demonstrated an understanding of the instructions.   The patient was advised to call back or seek an in-person evaluation if the symptoms worsen or if the condition fails to improve as anticipated.  I provided 40 minutes of non-face-to-face time during this encounter.  [name], LCSW 07/28/2019

## 2019-08-13 ENCOUNTER — Other Ambulatory Visit: Payer: Medicaid Other

## 2021-04-02 ENCOUNTER — Ambulatory Visit: Admit: 2021-04-02 | Discharge status: Absent without leave | Payer: Medicaid Other

## 2022-07-30 ENCOUNTER — Ambulatory Visit: Payer: Medicaid Other

## 2023-11-04 ENCOUNTER — Inpatient Hospital Stay (HOSPITAL_COMMUNITY)
Admission: AD | Admit: 2023-11-04 | Discharge: 2023-11-04 | Disposition: A | Discharge status: Home / Self Care | Attending: Obstetrics and Gynecology | Admitting: Obstetrics and Gynecology

## 2023-11-04 ENCOUNTER — Encounter (HOSPITAL_COMMUNITY): Payer: Self-pay | Admitting: Obstetrics and Gynecology

## 2023-11-04 DIAGNOSIS — O26892 Other specified pregnancy related conditions, second trimester: Secondary | ICD-10-CM

## 2023-11-04 DIAGNOSIS — R000 Tachycardia, unspecified: Secondary | ICD-10-CM | POA: Diagnosis not present

## 2023-11-04 DIAGNOSIS — O99891 Other specified diseases and conditions complicating pregnancy: Secondary | ICD-10-CM | POA: Insufficient documentation

## 2023-11-04 DIAGNOSIS — R0602 Shortness of breath: Secondary | ICD-10-CM | POA: Diagnosis not present

## 2023-11-04 DIAGNOSIS — Z3A17 17 weeks gestation of pregnancy: Secondary | ICD-10-CM | POA: Diagnosis not present

## 2023-11-04 DIAGNOSIS — R002 Palpitations: Secondary | ICD-10-CM | POA: Diagnosis present

## 2023-11-04 DIAGNOSIS — Z3492 Encounter for supervision of normal pregnancy, unspecified, second trimester: Secondary | ICD-10-CM

## 2023-11-04 LAB — URINALYSIS, ROUTINE W REFLEX MICROSCOPIC
Bilirubin Urine: NEGATIVE
Glucose, UA: NEGATIVE mg/dL
Hgb urine dipstick: NEGATIVE
Ketones, ur: 80 mg/dL — AB
Leukocytes,Ua: NEGATIVE
Nitrite: NEGATIVE
Protein, ur: NEGATIVE mg/dL
Specific Gravity, Urine: 1.018 (ref 1.005–1.030)
pH: 5 (ref 5.0–8.0)

## 2023-11-04 NOTE — Discharge Instructions (Signed)
 You were seen at the maternity assessment unit for shortness of breath, flushing, heart palpitations.  While you were here you had an EKG that showed your heart is in a normal rhythm.  Your exam was overall reassuring.  Given that you do have a family history of cardiac disease we are going to refer you to the [facility] cardiology group.  You will be scheduled with them in an outpatient setting.  They will often get an ultrasound of your heart to make sure all the parts of your heart look normal and they will often put on what is called a Zio patch or a sticker that monitors every heartbeat for about 1 to 2 weeks.  They will talk to you about this at your appointment.  Please return to the maternity assessment unit if you are continuing to have frequent fast heartbeats, you feel faint or you pass out, you have chest pain or any other concern.

## 2023-11-04 NOTE — MAU Note (Addendum)
 Jaime Golden is a 38 y.o. at [redacted]w[redacted]d here in MAU reporting: went to work at 0900, first episode happened shortly after getting to work today. started feeling heart palpitations and heart is going really really feel. Chest started feeling tight, feels like she can't take a deep breath.  Face feels hot and flushed. Has happened at least 10 times today. No hx. Concerned because her mother had a heart attack and died last year with no prior hx.  Onset of complaint: ~0900 Pain score: 3- when it comes Vitals:   11/04/23 1608 11/04/23 1609  BP:  116/61  Pulse:  69  Resp:  16  Temp:  98.5 F (36.9 C)  SpO2: 100% 100%     FHT:150 Lab orders placed from triage:     O2 sat 100% on room air while in triage.  Pulse did go up to 88 briefly (few seconds )one time. Mainly stayed 68-72.   EKG done in triage

## 2023-11-04 NOTE — MAU Provider Note (Signed)
 History     CSN: [id number]  Arrival date and time: 11/04/23 1557   Event Date/Time   First Provider Initiated Contact with Patient 11/04/23 1640      Chief Complaint  Patient presents with   Shortness of Breath   palpitaions   Chest Pain   Jaime Golden is a 38 year old female, G2P1, [redacted]w[redacted]d pregnant, presenting to MAU for new onset of episodic palpitations associated with facial flushing. Patient endorses this is the first time she has experienced any type of episode like this and nothing precipitates these episodes. She states these episodes have happened intermittently (roughly 10x today), with these episodes lasting 30 seconds to less than 1 minute, with associated chest tightness, shortness of breath, feeling her "heartbeat in ears", and facial flushing. History of anxiety without anxiety/panic attacks.Patient has never experienced this before, but expresses concern due to family history of cardiac disease and sudden death of her mother 1 year prior to ACS.   Shortness of Breath The current episode started today. Pertinent negatives include no abdominal pain, chest pain, fever, headaches or leg swelling.  Chest Pain  Associated symptoms include palpitations and shortness of breath. Pertinent negatives include no abdominal pain, dizziness, fever or headaches.    OB History     Gravida  2   Para  1   Term  1   Preterm      AB      Living  1      SAB      IAB      Ectopic      Multiple      Live Births  1           Past Medical History:  Diagnosis Date   Depression    Hx of varicella    PCOS (polycystic ovarian syndrome)    SVD (spontaneous vaginal delivery) 07/02/2013    Past Surgical History:  Procedure Laterality Date   BREAST ENHANCEMENT SURGERY  2007    Family History  Problem Relation Age of Onset   Hypertension Mother    Alcohol abuse Father    Cancer Maternal Grandmother        anal   Heart disease Maternal Grandfather    Cancer Paternal  Grandmother        colon   Heart disease Paternal Grandmother    Arthritis Neg Hx    Asthma Neg Hx    Birth defects Neg Hx    COPD Neg Hx    Drug abuse Neg Hx    Diabetes Neg Hx    Depression Neg Hx    Early death Neg Hx    Hearing loss Neg Hx    Hyperlipidemia Neg Hx    Kidney disease Neg Hx    Learning disabilities Neg Hx    Mental illness Neg Hx    Miscarriages / Stillbirths Neg Hx    Mental retardation Neg Hx    Stroke Neg Hx    Vision loss Neg Hx    Varicose Veins Neg Hx     Social History   Tobacco Use   Smoking status: Never   Smokeless tobacco: Never  Substance Use Topics   Alcohol use: No    Comment: socially   Drug use: No    Allergies: No Known Allergies  Medications Prior to Admission  Medication Sig Dispense Refill Last Dose/Taking   Prenatal Vit-Fe Fumarate-FA (MULTIVITAMIN-PRENATAL) 27-0.8 MG TABS tablet Take 1 tablet by mouth daily at 12 noon.  11/03/2023 Evening   benzonatate  (TESSALON ) 100 MG capsule Take 1 capsule (100 mg total) by mouth every 8 (eight) hours. 21 capsule 0    levonorgestrel (MIRENA) 20 MCG/24HR IUD 1 each by Intrauterine route once.      naproxen  (NAPROSYN ) 375 MG tablet Take 1 tablet (375 mg total) by mouth 2 (two) times daily. 20 tablet 0    venlafaxine XR (EFFEXOR-XR) 75 MG 24 hr capsule Take 75 mg by mouth daily with breakfast.       Review of Systems  Constitutional:  Negative for activity change, appetite change, chills and fever.  Eyes:  Negative for visual disturbance.  Respiratory:  Positive for chest tightness and shortness of breath.   Cardiovascular:  Positive for palpitations. Negative for chest pain and leg swelling.  Gastrointestinal:  Negative for abdominal pain, constipation and diarrhea.  Genitourinary:  Negative for dysuria, hematuria, pelvic pain, vaginal bleeding and vaginal discharge.  Neurological:  Negative for dizziness, syncope, light-headedness and headaches.   Physical Exam   Blood pressure 118/63,  pulse 64, temperature 98.5 F (36.9 C), temperature source Oral, resp. rate 16, height 5\' 3"  (1.6 m), weight 86.3 kg, last menstrual period 07/04/2023, SpO2 100%, unknown if currently breastfeeding.  Physical Exam HENT:     Head: Normocephalic.  Cardiovascular:     Rate and Rhythm: Normal rate and regular rhythm.     Pulses: Normal pulses.     Heart sounds: Murmur heard.     Systolic murmur is present with a grade of 2/6.     Comments: Favors benign systolic murmur Pulmonary:     Effort: Pulmonary effort is normal.     Breath sounds: Normal breath sounds.  Musculoskeletal:     Right lower leg: No edema.     Left lower leg: No edema.  Skin:    General: Skin is warm and dry.  Neurological:     General: No focal deficit present.     Mental Status: She is alert.  Psychiatric:        Mood and Affect: Mood is anxious.        Behavior: Behavior normal.     MAU Course  Procedures  MDM- Moderate  - EKG- reviewed and interpreted by myself and appears to have normal sinus - Continuous pulse ox for ~1 hr, with no deviations seen above 90bpm - Unremarkable physical exam (normal S1, S2, normal sounding systolic murmur)  Have considered normal variant heart rate changes or supraventricular tachycardia, which is a common arrhythmia in pregnant populations 2/2 to physiologic changes.   Have considered ACS, CAD, carotid artery stenosis, hypertensive crisis, GI illness, UTI, dehydration, anxiety/panic attack. Less likely ACS given benign exam and reassuring EKG without ST elevations. Infectious causes less likely given VS stable and lack of sx of ROS.  Could be component of anxiety but cannot rule out intermittent arrhythmia or structural heart disease which is best done in outpatient setting.      Assessment and Plan  # Palpitations, possible SVT - EKG: NSR with no acute changes  - Pulse ox while in MAU reveals 1-2 episodes of tachycardia, last less than 1 minute, < 90 with one possible  excursion to 120bpm for one beat. - Refer patient to [facility] for potentially Holter monitor/ Zio patch to further evaluate episodes.   # Second trimester pregnancy, [redacted]w[redacted]d - Continue to take prenatal vitamins - Follow-up with OB for regular prenatal visits  Discharge home in stable condition with return precautions  Jullie Oiler  Fredrica Jeans 11/04/2023, 5:52 PM

## 2023-11-04 NOTE — MAU Note (Signed)
 Pt reported had episode right before the student came in and again about ago.  Had picked up spike in pulse on o2 sat monitor .  MD aware.    Vss.  Pt to lobby.

## 2023-12-16 ENCOUNTER — Encounter: Payer: Self-pay | Admitting: *Deleted

## 2023-12-18 ENCOUNTER — Ambulatory Visit: Attending: Cardiology

## 2023-12-18 ENCOUNTER — Ambulatory Visit: Attending: Cardiology | Admitting: Cardiology

## 2023-12-18 ENCOUNTER — Encounter: Payer: Self-pay | Admitting: Cardiology

## 2023-12-18 VITALS — BP 92/64 | HR 68 | Ht 63.0 in | Wt 196.6 lb

## 2023-12-18 DIAGNOSIS — Z3A23 23 weeks gestation of pregnancy: Secondary | ICD-10-CM | POA: Diagnosis not present

## 2023-12-18 DIAGNOSIS — R002 Palpitations: Secondary | ICD-10-CM

## 2023-12-18 DIAGNOSIS — R0602 Shortness of breath: Secondary | ICD-10-CM | POA: Diagnosis present

## 2023-12-18 DIAGNOSIS — R0609 Other forms of dyspnea: Secondary | ICD-10-CM | POA: Diagnosis not present

## 2023-12-18 NOTE — Progress Notes (Unsigned)
 Enrolled patient for a 7 day Zio XT monitor to be mailed to patients home.

## 2023-12-18 NOTE — Patient Instructions (Addendum)
 Medication Instructions:  Your physician recommends that you continue on your current medications as directed. Please refer to the Current Medication list given to you today.  *If you need a refill on your cardiac medications before your next appointment, please call your pharmacy*  Lab Work: CMET, Mag, CBC If you have labs (blood work) drawn today and your tests are completely normal, you will receive your results only by: MyChart Message (if you have MyChart) OR A paper copy in the mail If you have any lab test that is abnormal or we need to change your treatment, we will call you to review the results.  Testing/Procedures: Your physician has requested that you have an echocardiogram. Echocardiography is a painless test that uses sound waves to create images of your heart. It provides your doctor with information about the size and shape of your heart and how well your heart's chambers and valves are working. This procedure takes approximately one hour. There are no restrictions for this procedure. Please do NOT wear cologne, perfume, aftershave, or lotions (deodorant is allowed). Please arrive 15 minutes prior to your appointment time.  Please note: We ask at that you not bring children with you during ultrasound (echo/ vascular) testing. Due to room size and safety concerns, children are not allowed in the ultrasound rooms during exams. Our front office staff cannot provide observation of children in our lobby area while testing is being conducted. An adult accompanying a patient to their appointment will only be allowed in the ultrasound room at the discretion of the ultrasound technician under special circumstances. We apologize for any inconvenience.  ZIO XT- Long Term Monitor Instructions  Your physician has requested you wear a ZIO patch monitor for 14 days.  This is a single patch monitor. [employer] supplies one patch monitor per enrollment. Additional stickers are not available. Please  do not apply patch if you will be having a Nuclear Stress Test,  Echocardiogram, Cardiac CT, MRI, or Chest Xray during the period you would be wearing the  monitor. The patch cannot be worn during these tests. You cannot remove and re-apply the  ZIO XT patch monitor.  Your ZIO patch monitor will be mailed 3 day USPS to your address on file. It may take 3-5 days  to receive your monitor after you have been enrolled.  Once you have received your monitor, please review the enclosed instructions. Your monitor  has already been registered assigning a specific monitor serial # to you.  Billing and Patient Assistance Program Information  We have supplied [employer] with any of your insurance information on file for billing purposes. [employer] offers a sliding scale Patient Assistance Program for patients that do not have  insurance, or whose insurance does not completely cover the cost of the ZIO monitor.  You must apply for the Patient Assistance Program to qualify for this discounted rate.  To apply, please call [employer] at [phone], select option 4, select option 2, ask to apply for  Patient Assistance Program. Meredeth will ask your household income, and how many people  are in your household. They will quote your out-of-pocket cost based on that information.  [employer] will also be able to set up a 74-month, interest-free payment plan if needed.  Applying the monitor   Shave hair from upper left chest.  Hold abrader disc by orange tab. Rub abrader in 40 strokes over the upper left chest as  indicated in your monitor instructions.  Clean area with 4 enclosed alcohol pads.  Let dry.  Apply patch as indicated in monitor instructions. Patch will be placed under collarbone on left  side of chest with arrow pointing upward.  Rub patch adhesive wings for 2 minutes. Remove white label marked 1. Remove the white  label marked 2. Rub patch adhesive wings for 2 additional minutes.  While looking in a  mirror, press and release button in center of patch. A small green light will  flash 3-4 times. This will be your only indicator that the monitor has been turned on.  Do not shower for the first 24 hours. You may shower after the first 24 hours.  Press the button if you feel a symptom. You will hear a small click. Record Date, Time and  Symptom in the Patient Logbook.  When you are ready to remove the patch, follow instructions on the last 2 pages of Patient  Logbook. Stick patch monitor onto the last page of Patient Logbook.  Place Patient Logbook in the blue and white box. Use locking tab on box and tape box closed  securely. The blue and white box has prepaid postage on it. Please place it in the mailbox as  soon as possible. Your physician should have your test results approximately 7 days after the  monitor has been mailed back to [facility].  Call [facility] Customer Care at [phone] if you have questions regarding  your ZIO XT patch monitor. Call them immediately if you see an orange light blinking on your  monitor.  If your monitor falls off in less than 4 days, contact our Monitor department at [phone].  If your monitor becomes loose or falls off after 4 days call [employer] at [phone] for  suggestions on securing your monitor   Follow-Up: At [facility], you and your health needs are our priority.  As part of our continuing mission to provide you with exceptional heart care, our providers are all part of one team.  This team includes your primary Cardiologist (physician) and Advanced Practice Providers or APPs (Physician Assistants and Nurse Practitioners) who all work together to provide you with the care you need, when you need it.  Your next appointment:   12 week(s)  Provider:   Kardie Tobb, DO

## 2023-12-18 NOTE — Progress Notes (Signed)
 Cardio-Obstetrics Clinic  New Evaluation  Date:  12/19/2023   ID:  Jaime Golden, DOB Feb 16, 1986, MRN [id number]  PCP:  Eldonna Suzen Octave, MD   [facility] Providers Cardiologist:  Dub Huntsman, DO  Electrophysiologist:  None       Referring MD: Eldonna Suzen Octave,*   Chief Complaint:   History of Present Illness:    Jaime Golden is a 38 y.o. female [G2P1001] who is being seen today for the evaluation of shortness of breath at the request of Eldonna Suzen Octave,*.   She experiences intermittent episodes of shortness of breath and palpitations throughout the day, with a racing heart that can wake her at night. Episodes last one to two minutes and recur every hour or two, ending with facial flushing and tingling. These symptoms are distinct from her previous anxiety experiences.  A monitor captured an episode during a hospital visit, showing a significant increase in pulse rate. She has not measured her blood pressure during episodes but generally has low blood pressure. There is no presyncope or syncope, though she sits down if episodes occur at work.  Family history includes her mother with hyperthyroidism and gout, and a family history of heart issues, though no relatives experienced similar symptoms during pregnancy.   Prior CV Studies Reviewed: The following studies were reviewed today: None   Past Medical History:  Diagnosis Date   Depression    Hx of varicella    PCOS (polycystic ovarian syndrome)    SVD (spontaneous vaginal delivery) 07/02/2013    Past Surgical History:  Procedure Laterality Date   BREAST ENHANCEMENT SURGERY  2007      OB History     Gravida  2   Para  1   Term  1   Preterm      AB      Living  1      SAB      IAB      Ectopic      Multiple      Live Births  1               Current Medications: Current Meds  Medication Sig   Prenatal Vit-Fe Fumarate-FA (MULTIVITAMIN-PRENATAL) 27-0.8 MG TABS  tablet Take 1 tablet by mouth daily at 12 noon.     Allergies:   Patient has no known allergies.   Social History   Socioeconomic History   Marital status: Significant Other    Spouse name: Not on file   Number of children: Not on file   Years of education: Not on file   Highest education level: Not on file  Occupational History   Not on file  Tobacco Use   Smoking status: Never   Smokeless tobacco: Never  Substance and Sexual Activity   Alcohol use: No    Comment: socially   Drug use: No   Sexual activity: Yes    Birth control/protection: Pill  Other Topics Concern   Not on file  Social History Narrative   Not on file   Social Drivers of Health   Financial Resource Strain: Not on file  Food Insecurity: Not on file  Transportation Needs: Not on file  Physical Activity: Not on file  Stress: Not on file  Social Connections: Not on file      Family History  Problem Relation Age of Onset   Hypertension Mother    Alcohol abuse Father    Cancer Maternal Grandmother  anal   Heart disease Maternal Grandfather    Cancer Paternal Grandmother        colon   Heart disease Paternal Grandmother    Arthritis Neg Hx    Asthma Neg Hx    Birth defects Neg Hx    COPD Neg Hx    Drug abuse Neg Hx    Diabetes Neg Hx    Depression Neg Hx    Early death Neg Hx    Hearing loss Neg Hx    Hyperlipidemia Neg Hx    Kidney disease Neg Hx    Learning disabilities Neg Hx    Mental illness Neg Hx    Miscarriages / Stillbirths Neg Hx    Mental retardation Neg Hx    Stroke Neg Hx    Vision loss Neg Hx    Varicose Veins Neg Hx       ROS:   Please see the history of present illness.     All other systems reviewed and are negative.   Labs/EKG Reviewed:    EKG:   None today   Recent Labs: 12/18/2023: ALT 15; BUN 7; Creatinine, Ser 0.61; Hemoglobin 11.8; Magnesium 1.8; Platelets 287; Potassium 4.1; Sodium 135   Recent Lipid Panel No results found for: CHOL, TRIG,  HDL, CHOLHDL, LDLCALC, LDLDIRECT  Physical Exam:    VS:  BP 92/64 (BP Location: Left Arm, Patient Position: Sitting, Cuff Size: Normal)   Pulse 68   Ht 5' 3 (1.6 m)   Wt 196 lb 9.6 oz (89.2 kg)   LMP 07/04/2023   SpO2 99%   BMI 34.83 kg/m     Wt Readings from Last 3 Encounters:  12/18/23 196 lb 9.6 oz (89.2 kg)  11/04/23 190 lb 3.2 oz (86.3 kg)  07/01/13 202 lb (91.6 kg)     GEN:  Well nourished, well developed in no acute distress HEENT: Normal NECK: No JVD; No carotid bruits LYMPHATICS: No lymphadenopathy CARDIAC: RRR, no murmurs, rubs, gallops RESPIRATORY:  Clear to auscultation without rales, wheezing or rhonchi  ABDOMEN: Soft, non-tender, non-distended MUSCULOSKELETAL:  No edema; No deformity  SKIN: Warm and dry NEUROLOGIC:  Alert and oriented x 3 PSYCHIATRIC:  Normal affect    Risk Assessment/Risk Calculators:     CARPREG II Risk Prediction Index Score:  1.  The patient's risk for a primary cardiac event is 5%.   Modified World Health Organization [facility]) Classification of Maternal CV Risk   Class I         ASSESSMENT & PLAN:    Shortness of breath and palpitations Intermittent palpitations and shortness of breath with episodes of heart racing and facial flushing. Low blood pressure limits pharmacological options. - Order serum magnesium and CBC. - Schedule echocardiogram. - Place 7-day Holter monitor. - Advise adequate hydration. - Avoid pharmacological treatment due to low blood pressure. - Instruct to avoid lying on the back.  Patient Instructions  Medication Instructions:  Your physician recommends that you continue on your current medications as directed. Please refer to the Current Medication list given to you today.  *If you need a refill on your cardiac medications before your next appointment, please call your pharmacy*  Lab Work: CMET, Mag, CBC If you have labs (blood work) drawn today and your tests are completely normal, you will  receive your results only by: MyChart Message (if you have MyChart) OR A paper copy in the mail If you have any lab test that is abnormal or we need to change your  treatment, we will call you to review the results.  Testing/Procedures: Your physician has requested that you have an echocardiogram. Echocardiography is a painless test that uses sound waves to create images of your heart. It provides your doctor with information about the size and shape of your heart and how well your heart's chambers and valves are working. This procedure takes approximately one hour. There are no restrictions for this procedure. Please do NOT wear cologne, perfume, aftershave, or lotions (deodorant is allowed). Please arrive 15 minutes prior to your appointment time.  Please note: We ask at that you not bring children with you during ultrasound (echo/ vascular) testing. Due to room size and safety concerns, children are not allowed in the ultrasound rooms during exams. Our front office staff cannot provide observation of children in our lobby area while testing is being conducted. An adult accompanying a patient to their appointment will only be allowed in the ultrasound room at the discretion of the ultrasound technician under special circumstances. We apologize for any inconvenience.  ZIO XT- Long Term Monitor Instructions  Your physician has requested you wear a ZIO patch monitor for 14 days.  This is a single patch monitor. [employer] supplies one patch monitor per enrollment. Additional stickers are not available. Please do not apply patch if you will be having a Nuclear Stress Test,  Echocardiogram, Cardiac CT, MRI, or Chest Xray during the period you would be wearing the  monitor. The patch cannot be worn during these tests. You cannot remove and re-apply the  ZIO XT patch monitor.  Your ZIO patch monitor will be mailed 3 day USPS to your address on file. It may take 3-5 days  to receive your monitor after you  have been enrolled.  Once you have received your monitor, please review the enclosed instructions. Your monitor  has already been registered assigning a specific monitor serial # to you.  Billing and Patient Assistance Program Information  We have supplied [employer] with any of your insurance information on file for billing purposes. [employer] offers a sliding scale Patient Assistance Program for patients that do not have  insurance, or whose insurance does not completely cover the cost of the ZIO monitor.  You must apply for the Patient Assistance Program to qualify for this discounted rate.  To apply, please call [employer] at [phone], select option 4, select option 2, ask to apply for  Patient Assistance Program. Meredeth will ask your household income, and how many people  are in your household. They will quote your out-of-pocket cost based on that information.  [employer] will also be able to set up a 51-month, interest-free payment plan if needed.  Applying the monitor   Shave hair from upper left chest.  Hold abrader disc by orange tab. Rub abrader in 40 strokes over the upper left chest as  indicated in your monitor instructions.  Clean area with 4 enclosed alcohol pads. Let dry.  Apply patch as indicated in monitor instructions. Patch will be placed under collarbone on left  side of chest with arrow pointing upward.  Rub patch adhesive wings for 2 minutes. Remove white label marked 1. Remove the white  label marked 2. Rub patch adhesive wings for 2 additional minutes.  While looking in a mirror, press and release button in center of patch. A small green light will  flash 3-4 times. This will be your only indicator that the monitor has been turned on.  Do not shower for the first 24 hours.  You may shower after the first 24 hours.  Press the button if you feel a symptom. You will hear a small click. Record Date, Time and  Symptom in the Patient Logbook.  When you are ready to  remove the patch, follow instructions on the last 2 pages of Patient  Logbook. Stick patch monitor onto the last page of Patient Logbook.  Place Patient Logbook in the blue and white box. Use locking tab on box and tape box closed  securely. The blue and white box has prepaid postage on it. Please place it in the mailbox as  soon as possible. Your physician should have your test results approximately 7 days after the  monitor has been mailed back to [employer].  Call [facility] Customer Care at [phone] if you have questions regarding  your ZIO XT patch monitor. Call them immediately if you see an orange light blinking on your  monitor.  If your monitor falls off in less than 4 days, contact our Monitor department at [phone].  If your monitor becomes loose or falls off after 4 days call [employer] at [phone] for  suggestions on securing your monitor   Follow-Up: At [facility], you and your health needs are our priority.  As part of our continuing mission to provide you with exceptional heart care, our providers are all part of one team.  This team includes your primary Cardiologist (physician) and Advanced Practice Providers or APPs (Physician Assistants and Nurse Practitioners) who all work together to provide you with the care you need, when you need it.  Your next appointment:   12 week(s)  Provider:   Braedan Meuth, DO    Dispo:  No follow-ups on file.   Medication Adjustments/Labs and Tests Ordered: Current medicines are reviewed at length with the patient today.  Concerns regarding medicines are outlined above.  Tests Ordered: Orders Placed This Encounter  Procedures   Comp Met (CMET)   Magnesium   CBC   LONG TERM MONITOR (3-14 DAYS)   ECHOCARDIOGRAM COMPLETE   Medication Changes: No orders of the defined types were placed in this encounter.

## 2023-12-19 LAB — COMPREHENSIVE METABOLIC PANEL WITH GFR
ALT: 15 IU/L (ref 0–32)
AST: 15 IU/L (ref 0–40)
Albumin: 3.6 g/dL — ABNORMAL LOW (ref 3.9–4.9)
Alkaline Phosphatase: 58 IU/L (ref 44–121)
BUN/Creatinine Ratio: 11 (ref 9–23)
BUN: 7 mg/dL (ref 6–20)
Bilirubin Total: 0.5 mg/dL (ref 0.0–1.2)
CO2: 18 mmol/L — ABNORMAL LOW (ref 20–29)
Calcium: 9 mg/dL (ref 8.7–10.2)
Chloride: 103 mmol/L (ref 96–106)
Creatinine, Ser: 0.61 mg/dL (ref 0.57–1.00)
Globulin, Total: 2.8 g/dL (ref 1.5–4.5)
Glucose: 74 mg/dL (ref 70–99)
Potassium: 4.1 mmol/L (ref 3.5–5.2)
Sodium: 135 mmol/L (ref 134–144)
Total Protein: 6.4 g/dL (ref 6.0–8.5)
eGFR: 118 mL/min/1.73 (ref >59)

## 2023-12-19 LAB — MAGNESIUM: Magnesium: 1.8 mg/dL (ref 1.6–2.3)

## 2023-12-19 LAB — CBC
Hematocrit: 36.4 % (ref 34.0–46.6)
Hemoglobin: 11.8 g/dL (ref 11.1–15.9)
MCH: 30.7 pg (ref 26.6–33.0)
MCHC: 32.4 g/dL (ref 31.5–35.7)
MCV: 95 fL (ref 79–97)
Platelets: 287 x10E3/uL (ref 150–450)
RBC: 3.84 x10E6/uL (ref 3.77–5.28)
RDW: 12 % (ref 11.7–15.4)
WBC: 11.5 x10E3/uL — ABNORMAL HIGH (ref 3.4–10.8)

## 2023-12-25 ENCOUNTER — Ambulatory Visit: Payer: Self-pay | Admitting: Cardiology

## 2024-01-05 ENCOUNTER — Ambulatory Visit (HOSPITAL_COMMUNITY)
Admission: RE | Admit: 2024-01-05 | Discharge: 2024-01-05 | Disposition: A | Discharge status: Home / Self Care | Source: Ambulatory Visit | Attending: Cardiology | Admitting: Cardiology

## 2024-01-05 DIAGNOSIS — R0609 Other forms of dyspnea: Secondary | ICD-10-CM | POA: Insufficient documentation

## 2024-01-05 DIAGNOSIS — R0602 Shortness of breath: Secondary | ICD-10-CM | POA: Diagnosis not present

## 2024-01-05 LAB — ECHOCARDIOGRAM COMPLETE
Area-P 1/2: 4.27 cm2
S' Lateral: 2.8 cm

## 2024-01-13 DIAGNOSIS — R002 Palpitations: Secondary | ICD-10-CM | POA: Diagnosis not present

## 2024-03-12 ENCOUNTER — Encounter: Payer: Self-pay | Admitting: Cardiology

## 2024-03-12 ENCOUNTER — Ambulatory Visit: Attending: Cardiology | Admitting: Cardiology

## 2024-03-12 VITALS — BP 106/70 | HR 74 | Ht 63.0 in | Wt 208.4 lb

## 2024-03-12 DIAGNOSIS — Z3A36 36 weeks gestation of pregnancy: Secondary | ICD-10-CM

## 2024-03-12 DIAGNOSIS — R002 Palpitations: Secondary | ICD-10-CM | POA: Diagnosis not present

## 2024-03-12 DIAGNOSIS — R0602 Shortness of breath: Secondary | ICD-10-CM

## 2024-03-12 NOTE — Progress Notes (Signed)
 Cardio-Obstetrics Clinic  Follow Up Note   Date:  03/12/2024   ID:  Jaime Golden, DOB Mar 16, 1986, MRN [id number]  PCP:  Eldonna Suzen Octave, MD   [facility] Providers Cardiologist:  Dub Huntsman, DO  Electrophysiologist:  None        Referring MD: Eldonna Suzen Octave,*   Chief Complaint:   History of Present Illness:    Jaime Golden is a 38 y.o. female [G2P1001] who returns for follow up cardiovascular care in pregnancy.  First follow-up patient in July 2025 at that time she presented for shortness of breath and palpitations.  A monitor was placed on the patient as well as echocardiogram was ordered.  Her monitor did not show any evidence of arrhythmia and her echo prior normal.  She is here today for follow-up visit   Prior CV Studies Reviewed: The following studies were reviewed today: Zio and echo  Past Medical History:  Diagnosis Date   Depression    Hx of varicella    PCOS (polycystic ovarian syndrome)    SVD (spontaneous vaginal delivery) 07/02/2013    Past Surgical History:  Procedure Laterality Date   BREAST ENHANCEMENT SURGERY  2007      OB History     Gravida  2   Para  1   Term  1   Preterm      AB      Living  1      SAB      IAB      Ectopic      Multiple      Live Births  1               Current Medications: Current Meds  Medication Sig   Prenatal Vit-Fe Fumarate-FA (MULTIVITAMIN-PRENATAL) 27-0.8 MG TABS tablet Take 1 tablet by mouth daily at 12 noon.     Allergies:   Patient has no known allergies.   Social History   Socioeconomic History   Marital status: Significant Other    Spouse name: Not on file   Number of children: Not on file   Years of education: Not on file   Highest education level: Not on file  Occupational History   Not on file  Tobacco Use   Smoking status: Never   Smokeless tobacco: Never  Substance and Sexual Activity   Alcohol use: No    Comment: socially   Drug  use: No   Sexual activity: Yes    Birth control/protection: Pill  Other Topics Concern   Not on file  Social History Narrative   Not on file   Social Drivers of Health   Financial Resource Strain: Not on file  Food Insecurity: Low Risk  (02/16/2024)   Received from [facility]   Hunger Vital Sign    Within the past 12 months, you worried that your food would run out before you got money to buy more: Never true    Within the past 12 months, the food you bought just didn't last and you didn't have money to get more. : Never true  Transportation Needs: No Transportation Needs (02/16/2024)   Received from [employer]    In the past 12 months, has lack of reliable transportation kept you from medical appointments, meetings, work or from getting things needed for daily living? : No  Physical Activity: Not on file  Stress: Not on file  Social Connections: Not on file      Family  History  Problem Relation Age of Onset   Hypertension Mother    Alcohol abuse Father    Cancer Maternal Grandmother        anal   Heart disease Maternal Grandfather    Cancer Paternal Grandmother        colon   Heart disease Paternal Grandmother    Arthritis Neg Hx    Asthma Neg Hx    Birth defects Neg Hx    COPD Neg Hx    Drug abuse Neg Hx    Diabetes Neg Hx    Depression Neg Hx    Early death Neg Hx    Hearing loss Neg Hx    Hyperlipidemia Neg Hx    Kidney disease Neg Hx    Learning disabilities Neg Hx    Mental illness Neg Hx    Miscarriages / Stillbirths Neg Hx    Mental retardation Neg Hx    Stroke Neg Hx    Vision loss Neg Hx    Varicose Veins Neg Hx       ROS:   Please see the history of present illness.     All other systems reviewed and are negative.   Labs/EKG Reviewed:    EKG:  None today    Recent Labs: 12/18/2023: ALT 15; BUN 7; Creatinine, Ser 0.61; Hemoglobin 11.8; Magnesium 1.8; Platelets 287; Potassium 4.1; Sodium 135   Recent Lipid Panel No  results found for: CHOL, TRIG, HDL, CHOLHDL, LDLCALC, LDLDIRECT  Physical Exam:    VS:  BP 106/70 (BP Location: Right Arm, Patient Position: Sitting, Cuff Size: Normal)   Pulse 74   Ht 5' 3 (1.6 m)   Wt 208 lb 6.4 oz (94.5 kg)   LMP 07/04/2023   SpO2 99%   BMI 36.92 kg/m     Wt Readings from Last 3 Encounters:  03/12/24 208 lb 6.4 oz (94.5 kg)  12/18/23 196 lb 9.6 oz (89.2 kg)  11/04/23 190 lb 3.2 oz (86.3 kg)     GEN:  Well nourished, well developed in no acute distress HEENT: Normal NECK: No JVD; No carotid bruits LYMPHATICS: No lymphadenopathy CARDIAC: RRR, no murmurs, rubs, gallops RESPIRATORY:  Clear to auscultation without rales, wheezing or rhonchi  ABDOMEN: Soft, non-tender, non-distended MUSCULOSKELETAL:  No edema; No deformity  SKIN: Warm and dry NEUROLOGIC:  Alert and oriented x 3 PSYCHIATRIC:  Normal affect    Risk Assessment/Risk Calculators:     CARPREG II Risk Prediction Index Score:  1.  The patient's risk for a primary cardiac event is 5%.            ASSESSMENT & PLAN:    Shortness of breath Palpitations 36 weeks of gestation  Thankfully this is all resolved.  Her imaging as well as the ZIO monitor did not show any abnormalities.  She is doing well looking forward to delivery.  She will follow-up as needed.  Advised the patient postpartum to continue to check her blood pressure if greater than 140/90 to notify my office    Patient Instructions  Medication Instructions:  Your physician recommends that you continue on your current medications as directed. Please refer to the Current Medication list given to you today.  *If you need a refill on your cardiac medications before your next appointment, please call your pharmacy*  Follow-Up: At [facility], you and your health needs are our priority.  As part of our continuing mission to provide you with exceptional heart care, our providers are all  part of one team.  This  team includes your primary Cardiologist (physician) and Advanced Practice Providers or APPs (Physician Assistants and Nurse Practitioners) who all work together to provide you with the care you need, when you need it.  Your next appointment:    As needed  Provider:   Issiah Huffaker, DO    Other Instructions Please take your blood pressure daily for 2 weeks and send in a MyChart message. Please include heart rates. If your blood pressure is greater then 140/90 please contact my office.   HOW TO TAKE YOUR BLOOD PRESSURE: Rest 5 minutes before taking your blood pressure. Don't smoke or drink caffeinated beverages for at least 30 minutes before. Take your blood pressure before (not after) you eat. Sit comfortably with your back supported and both feet on the floor (don't cross your legs). Elevate your arm to heart level on a table or a desk. Use the proper sized cuff. It should fit smoothly and snugly around your bare upper arm. There should be enough room to slip a fingertip under the cuff. The bottom edge of the cuff should be 1 inch above the crease of the elbow. Ideally, take 3 measurements at one sitting and record the average.            Dispo:  No follow-ups on file.   Medication Adjustments/Labs and Tests Ordered: Current medicines are reviewed at length with the patient today.  Concerns regarding medicines are outlined above.  Tests Ordered: No orders of the defined types were placed in this encounter.  Medication Changes: No orders of the defined types were placed in this encounter.

## 2024-03-12 NOTE — Patient Instructions (Signed)
 Medication Instructions:  Your physician recommends that you continue on your current medications as directed. Please refer to the Current Medication list given to you today.  *If you need a refill on your cardiac medications before your next appointment, please call your pharmacy*  Follow-Up: At [facility], you and your health needs are our priority.  As part of our continuing mission to provide you with exceptional heart care, our providers are all part of one team.  This team includes your primary Cardiologist (physician) and Advanced Practice Providers or APPs (Physician Assistants and Nurse Practitioners) who all work together to provide you with the care you need, when you need it.  Your next appointment:    As needed  Provider:   Kardie Tobb, DO    Other Instructions Please take your blood pressure daily for 2 weeks and send in a MyChart message. Please include heart rates. If your blood pressure is greater then 140/90 please contact my office.   HOW TO TAKE YOUR BLOOD PRESSURE: Rest 5 minutes before taking your blood pressure. Don't smoke or drink caffeinated beverages for at least 30 minutes before. Take your blood pressure before (not after) you eat. Sit comfortably with your back supported and both feet on the floor (don't cross your legs). Elevate your arm to heart level on a table or a desk. Use the proper sized cuff. It should fit smoothly and snugly around your bare upper arm. There should be enough room to slip a fingertip under the cuff. The bottom edge of the cuff should be 1 inch above the crease of the elbow. Ideally, take 3 measurements at one sitting and record the average.
